# Patient Record
Sex: Female | Born: 1955 | Race: White | Hispanic: No | Marital: Married | Smoking: Never smoker
Health system: Southern US, Community
[De-identification: ages and names within clinical notes are randomized; demographics above are authoritative.]

## PROBLEM LIST (undated history)

## (undated) DIAGNOSIS — E559 Vitamin D deficiency, unspecified: Secondary | ICD-10-CM

## (undated) DIAGNOSIS — I10 Essential (primary) hypertension: Secondary | ICD-10-CM

## (undated) DIAGNOSIS — E785 Hyperlipidemia, unspecified: Secondary | ICD-10-CM

## (undated) DIAGNOSIS — M8580 Other specified disorders of bone density and structure, unspecified site: Secondary | ICD-10-CM

## (undated) DIAGNOSIS — N393 Stress incontinence (female) (male): Secondary | ICD-10-CM

## (undated) HISTORY — PX: ABDOMINAL HYSTERECTOMY: SHX81

---

## 2005-10-19 ENCOUNTER — Ambulatory Visit: Payer: Self-pay | Admitting: Family Medicine

## 2005-12-21 ENCOUNTER — Ambulatory Visit: Payer: Self-pay | Admitting: Family Medicine

## 2006-12-24 ENCOUNTER — Ambulatory Visit: Payer: Self-pay | Admitting: Family Medicine

## 2007-07-29 ENCOUNTER — Emergency Department: Payer: Self-pay | Admitting: Emergency Medicine

## 2008-02-14 ENCOUNTER — Ambulatory Visit: Payer: Self-pay | Admitting: Family Medicine

## 2008-03-03 ENCOUNTER — Ambulatory Visit: Payer: Self-pay | Admitting: Family Medicine

## 2008-05-03 ENCOUNTER — Ambulatory Visit: Payer: Self-pay | Admitting: Gastroenterology

## 2008-07-18 IMAGING — CT CT HEAD WITHOUT CONTRAST
2 series · 16 of 30 positions shown, 20 images · non-contrast
Comparison: none

REASON FOR EXAM: fall head trauma
COMMENTS:

PROCEDURE:     CT  - CT HEAD WITHOUT CONTRAST  - July 29, 2007  [DATE]
RESULT:     Comparison: No available comparison exam.
Procedure: CT examination of the head was performed without intravenous
contrast. Collimation is 5 mm.

[Series 2: without · axial · non-contrast · 0.42mm/px · z∈[+1112,+1232]mm · 13 of 30 slices shown, 17 images]
[im 3/30  brain]
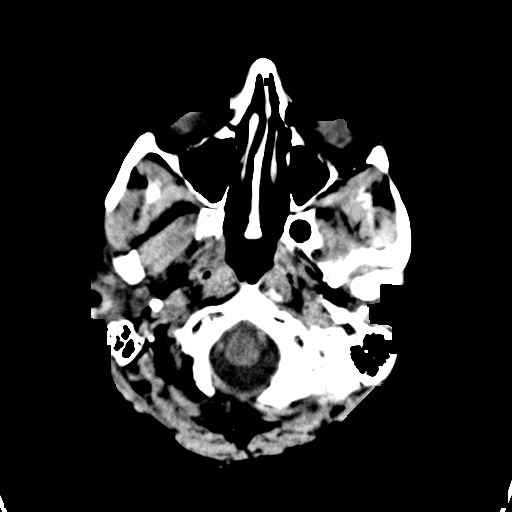
[im 3/30  bone]
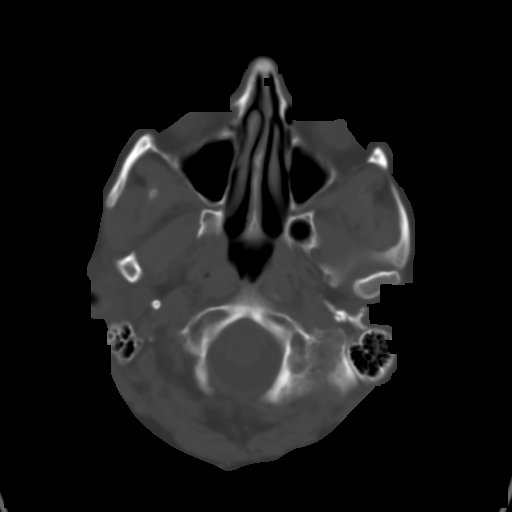
[im 5/30  brain]
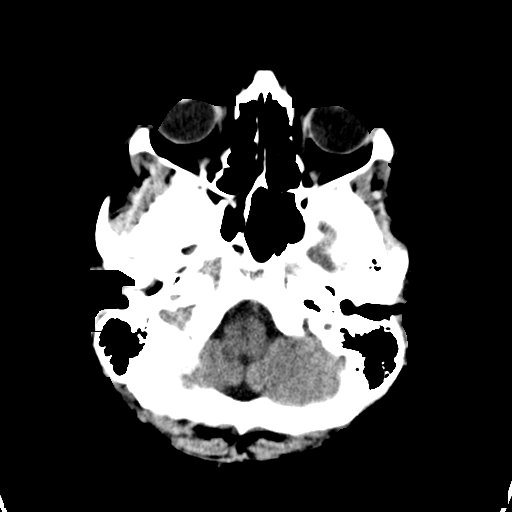
[im 7/30  brain]
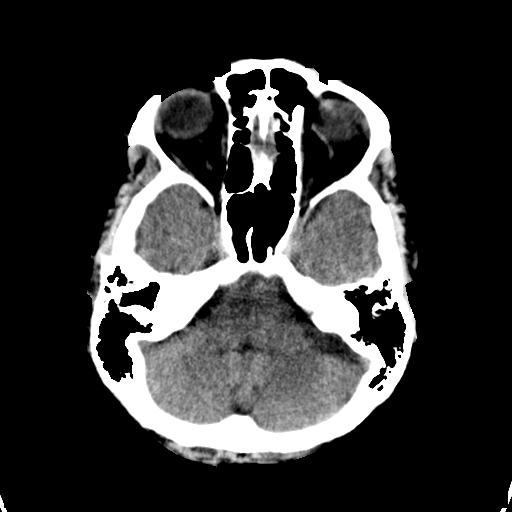
[im 9/30  brain]
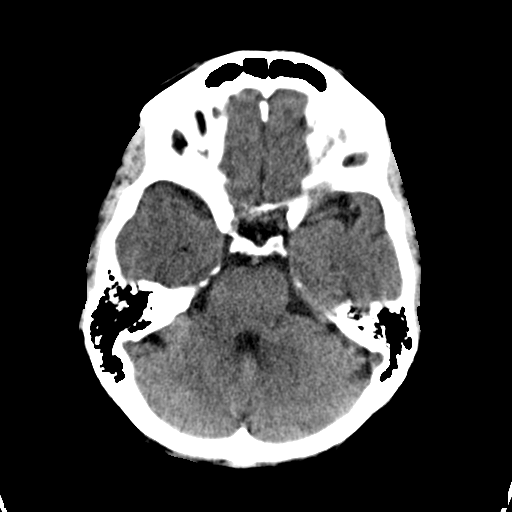
[im 11/30  brain]
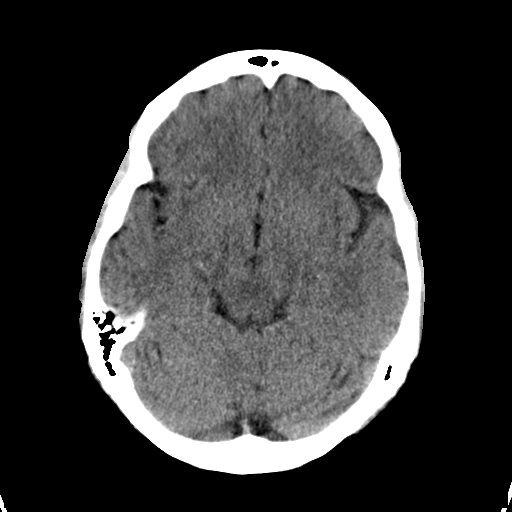
[im 11/30  bone]
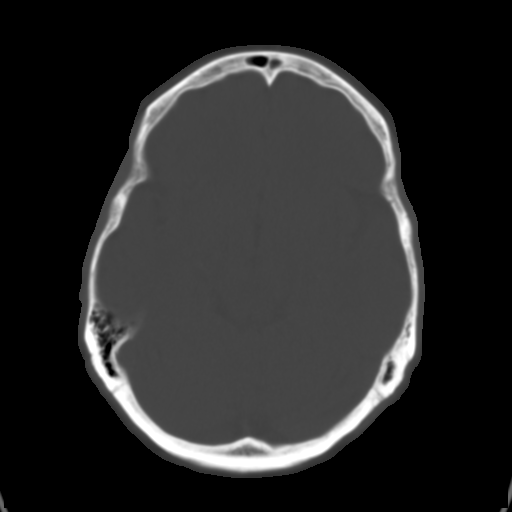
[im 13/30  brain]
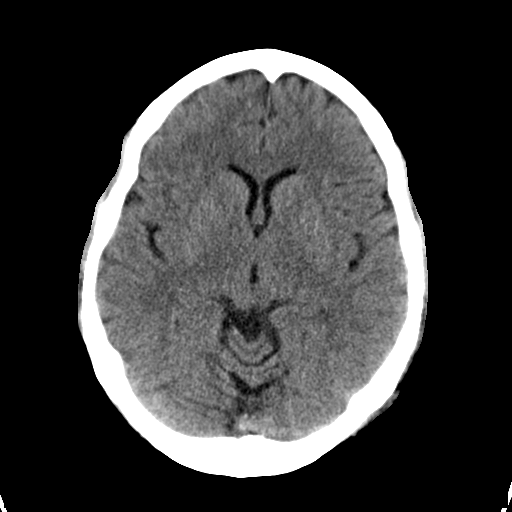
[im 15/30  brain]
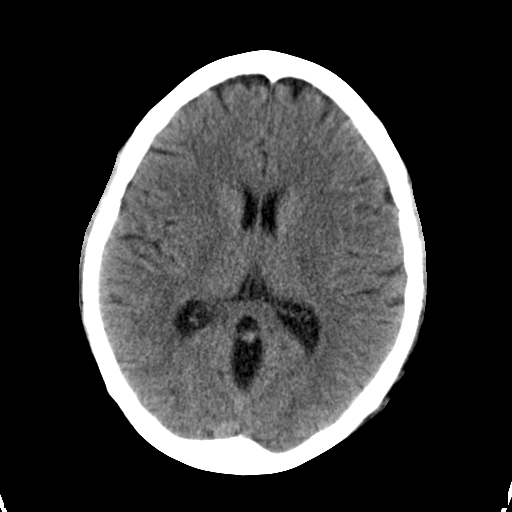
[im 17/30  brain]
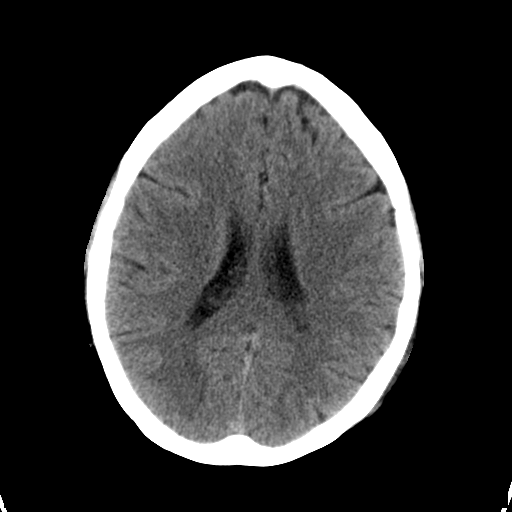
[im 19/30  brain]
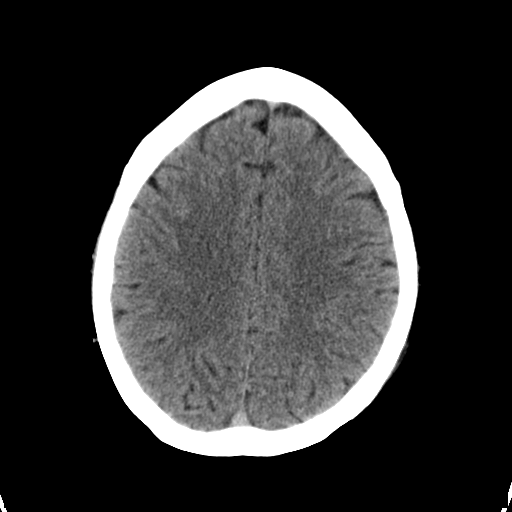
[im 19/30  bone]
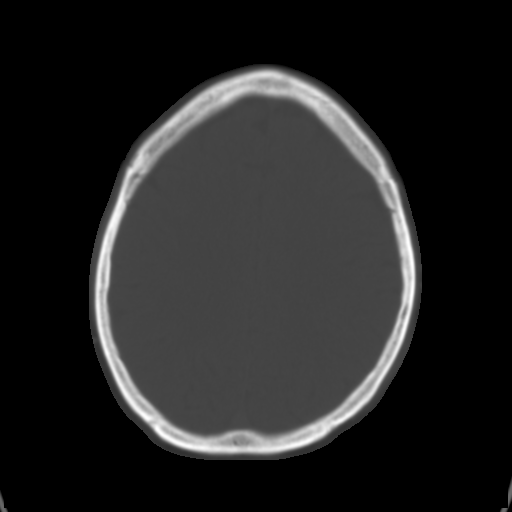
[im 21/30  brain]
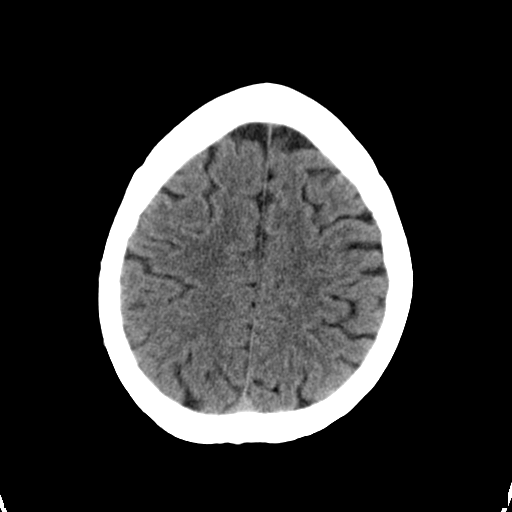
[im 23/30  brain]
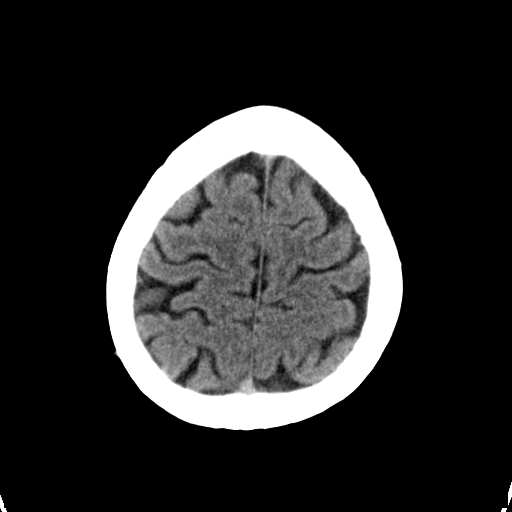
[im 25/30  brain]
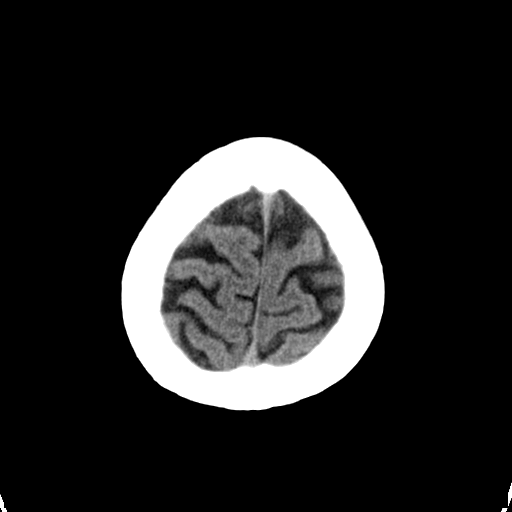
[im 27/30  brain]
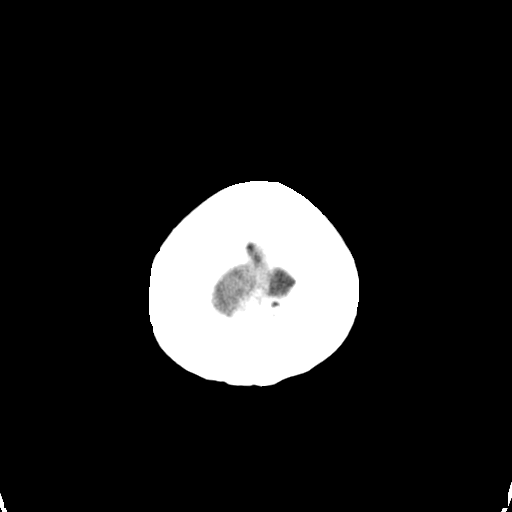
[im 27/30  bone]
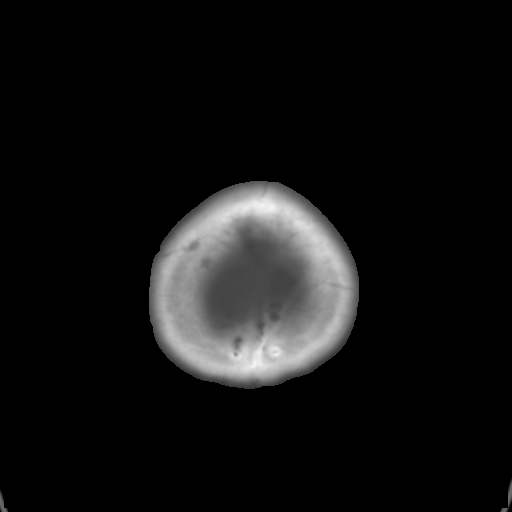

[Series 3: bone · axial · 0.42mm/px · z∈[+1112,+1152]mm · 3 of 30 slices shown]
[im 3/30  bone]
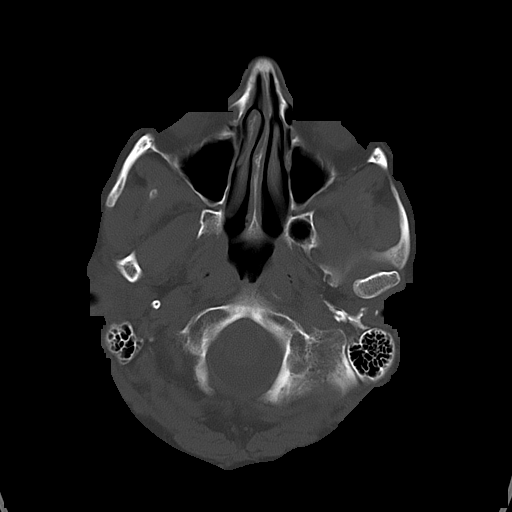
[im 7/30  bone]
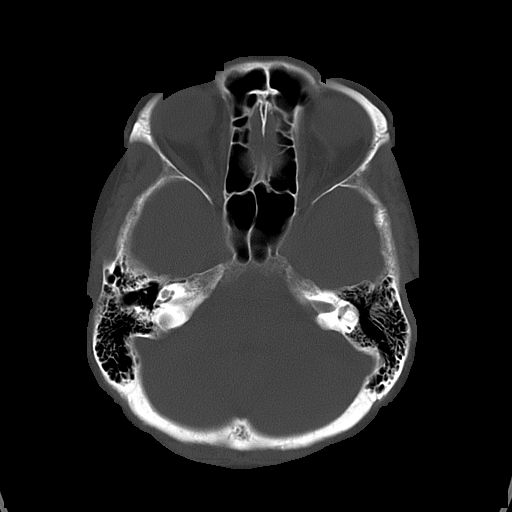
[im 11/30  bone]
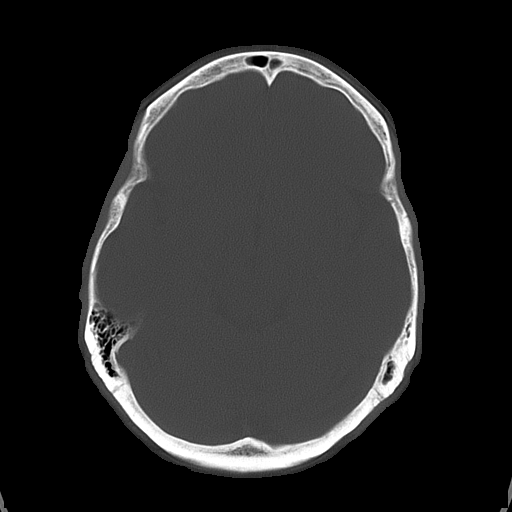

[16 of 30 positions shown; findings below may reference images not displayed]

FINDINGS: No evidence of intracranial hemorrhage, mass-effect, or ventricular
dilatation. The gray and white matters are differentiated. No displaced
calvarial fracture is noted. The partially visualized paranasal sinuses and
mastoid air cells are unremarkable.
IMPRESSION: 1. No evidence of acute intra cranial hemorrhage nor displaced calvarial
fracture.

Preliminary report was faxed to the emergency room by the night radiologist
shortly after the study was performed.

## 2009-05-17 ENCOUNTER — Ambulatory Visit: Payer: Self-pay | Admitting: Family Medicine

## 2010-11-19 ENCOUNTER — Ambulatory Visit: Payer: Self-pay | Admitting: Family Medicine

## 2011-03-10 DIAGNOSIS — I10 Essential (primary) hypertension: Secondary | ICD-10-CM | POA: Insufficient documentation

## 2012-01-01 ENCOUNTER — Ambulatory Visit: Payer: Self-pay | Admitting: Family Medicine

## 2012-04-24 ENCOUNTER — Ambulatory Visit: Payer: Self-pay

## 2013-12-06 DIAGNOSIS — E559 Vitamin D deficiency, unspecified: Secondary | ICD-10-CM | POA: Insufficient documentation

## 2014-01-01 ENCOUNTER — Ambulatory Visit: Payer: Self-pay | Admitting: Family Medicine

## 2016-12-30 ENCOUNTER — Other Ambulatory Visit: Payer: Self-pay | Admitting: Family Medicine

## 2016-12-30 DIAGNOSIS — Z1231 Encounter for screening mammogram for malignant neoplasm of breast: Secondary | ICD-10-CM

## 2017-01-06 ENCOUNTER — Ambulatory Visit
Admission: RE | Admit: 2017-01-06 | Discharge: 2017-01-06 | Disposition: A | Discharge status: Home / Self Care | Payer: 59 | Source: Ambulatory Visit | Attending: Family Medicine | Admitting: Family Medicine

## 2017-01-06 ENCOUNTER — Encounter: Payer: Self-pay | Admitting: Radiology

## 2017-01-06 DIAGNOSIS — Z1231 Encounter for screening mammogram for malignant neoplasm of breast: Secondary | ICD-10-CM | POA: Insufficient documentation

## 2017-01-19 ENCOUNTER — Other Ambulatory Visit: Payer: Self-pay | Admitting: Family Medicine

## 2017-01-19 DIAGNOSIS — M8580 Other specified disorders of bone density and structure, unspecified site: Secondary | ICD-10-CM

## 2017-09-22 ENCOUNTER — Ambulatory Visit
Admission: RE | Admit: 2017-09-22 | Discharge: 2017-09-22 | Disposition: A | Discharge status: Home / Self Care | Payer: 59 | Source: Ambulatory Visit | Attending: Internal Medicine | Admitting: Internal Medicine

## 2017-09-22 ENCOUNTER — Encounter
Admission: RE | Disposition: A | Discharge status: Home / Self Care | Payer: Self-pay | Source: Ambulatory Visit | Attending: Internal Medicine

## 2017-09-22 ENCOUNTER — Encounter: Payer: Self-pay | Admitting: *Deleted

## 2017-09-22 ENCOUNTER — Ambulatory Visit: Payer: 59 | Admitting: Anesthesiology

## 2017-09-22 DIAGNOSIS — Z888 Allergy status to other drugs, medicaments and biological substances status: Secondary | ICD-10-CM | POA: Diagnosis not present

## 2017-09-22 DIAGNOSIS — E785 Hyperlipidemia, unspecified: Secondary | ICD-10-CM | POA: Insufficient documentation

## 2017-09-22 DIAGNOSIS — E559 Vitamin D deficiency, unspecified: Secondary | ICD-10-CM | POA: Diagnosis not present

## 2017-09-22 DIAGNOSIS — I10 Essential (primary) hypertension: Secondary | ICD-10-CM | POA: Diagnosis not present

## 2017-09-22 DIAGNOSIS — Z882 Allergy status to sulfonamides status: Secondary | ICD-10-CM | POA: Insufficient documentation

## 2017-09-22 DIAGNOSIS — Z79899 Other long term (current) drug therapy: Secondary | ICD-10-CM | POA: Insufficient documentation

## 2017-09-22 DIAGNOSIS — M8580 Other specified disorders of bone density and structure, unspecified site: Secondary | ICD-10-CM | POA: Insufficient documentation

## 2017-09-22 DIAGNOSIS — Z1211 Encounter for screening for malignant neoplasm of colon: Secondary | ICD-10-CM | POA: Diagnosis not present

## 2017-09-22 DIAGNOSIS — K5730 Diverticulosis of large intestine without perforation or abscess without bleeding: Secondary | ICD-10-CM | POA: Insufficient documentation

## 2017-09-22 DIAGNOSIS — K640 First degree hemorrhoids: Secondary | ICD-10-CM | POA: Insufficient documentation

## 2017-09-22 HISTORY — DX: Other specified disorders of bone density and structure, unspecified site: M85.80

## 2017-09-22 HISTORY — DX: Stress incontinence (female) (male): N39.3

## 2017-09-22 HISTORY — DX: Essential (primary) hypertension: I10

## 2017-09-22 HISTORY — PX: COLONOSCOPY WITH PROPOFOL: SHX5780

## 2017-09-22 HISTORY — DX: Hyperlipidemia, unspecified: E78.5

## 2017-09-22 HISTORY — DX: Vitamin D deficiency, unspecified: E55.9

## 2017-09-22 SURGERY — COLONOSCOPY WITH PROPOFOL
Anesthesia: General

## 2017-09-22 MED ORDER — PROPOFOL 10 MG/ML IV BOLUS
INTRAVENOUS | Status: AC
  Filled 2017-09-22: qty 20

## 2017-09-22 MED ORDER — FENTANYL CITRATE (PF) 100 MCG/2ML IJ SOLN
INTRAMUSCULAR | Status: DC | PRN
  Administered 2017-09-22 (×2): 50 ug via INTRAVENOUS

## 2017-09-22 MED ORDER — SODIUM CHLORIDE 0.9 % IV SOLN
INTRAVENOUS | Status: DC
  Administered 2017-09-22: 09:00:00 via INTRAVENOUS

## 2017-09-22 MED ORDER — LIDOCAINE 2% (20 MG/ML) 5 ML SYRINGE
INTRAMUSCULAR | Status: DC | PRN
  Administered 2017-09-22: 30 mg via INTRAVENOUS

## 2017-09-22 MED ORDER — PROPOFOL 10 MG/ML IV BOLUS
INTRAVENOUS | Status: DC | PRN
  Administered 2017-09-22: 100 mg via INTRAVENOUS

## 2017-09-22 MED ORDER — PROPOFOL 500 MG/50ML IV EMUL
INTRAVENOUS | Status: AC
  Filled 2017-09-22: qty 50

## 2017-09-22 MED ORDER — FENTANYL CITRATE (PF) 100 MCG/2ML IJ SOLN
INTRAMUSCULAR | Status: AC
  Filled 2017-09-22: qty 2

## 2017-09-22 MED ORDER — PROPOFOL 500 MG/50ML IV EMUL
INTRAVENOUS | Status: DC | PRN
  Administered 2017-09-22: 140 ug/kg/min via INTRAVENOUS

## 2017-09-22 NOTE — Interval H&P Note (Signed)
History and Physical Interval Note:  09/22/2017 9:16 AM  Robin Beck  has presented today for surgery, with the diagnosis of SCREENING  The various methods of treatment have been discussed with the patient and family. After consideration of risks, benefits and other options for treatment, the patient has consented to  Procedure(s): COLONOSCOPY WITH PROPOFOL (N/A) as a surgical intervention .  The patient's history has been reviewed, patient examined, no change in status, stable for surgery.  I have reviewed the patient's chart and labs.  Questions were answered to the patient's satisfaction.     Vernon Hillsoledo, [place]

## 2017-09-22 NOTE — Op Note (Signed)
[facility] Gastroenterology Patient Name: Robin Beck Procedure Date: 09/22/2017 9:14 AM MRN: [id number] Account #: [id number] Date of Birth: 09-29-1955 Admit Type: Outpatient Age: 62 Room: [facility] ENDO ROOM 2 Gender: Female Note Status: Finalized Procedure:            Colonoscopy Indications:          Screening for colorectal malignant neoplasm Providers:            Boykin Nearing. Taja Pentland MD, MD Medicines:            Propofol per Anesthesia Complications:        No immediate complications. Procedure:            Pre-Anesthesia Assessment:                       - The risks and benefits of the procedure and the                        sedation options and risks were discussed with the                        patient. All questions were answered and informed                        consent was obtained.                       - Patient identification and proposed procedure were                        verified prior to the procedure by the nurse. The                        procedure was verified in the procedure room.                       - ASA Grade Assessment: II - A patient with mild                        systemic disease.                       - After reviewing the risks and benefits, the patient                        was deemed in satisfactory condition to undergo the                        procedure.                       After obtaining informed consent, the colonoscope was                        passed under direct vision. Throughout the procedure,                        the patient's blood pressure, pulse, and oxygen                        saturations were monitored continuously. The  Colonoscope was introduced through the anus and                        advanced to the the cecum, identified by appendiceal                        orifice and ileocecal valve. The colonoscopy was                        performed without difficulty. The patient  tolerated the                        procedure well. The quality of the bowel preparation                        was good. The ileocecal valve, appendiceal orifice, and                        rectum were photographed. Findings:      The perianal and digital rectal examinations were normal. Pertinent       negatives include normal sphincter tone and no palpable rectal lesions.      Normal mucosa was found in the entire colon.      A few small-mouthed diverticula were found in the sigmoid colon. There       was no evidence of diverticular bleeding.      Non-bleeding internal hemorrhoids were found during retroflexion. The       hemorrhoids were Grade I (internal hemorrhoids that do not prolapse).      The exam was otherwise without abnormality. Impression:           - Normal mucosa in the entire examined colon.                       - Mild diverticulosis in the sigmoid colon. There was                        no evidence of diverticular bleeding.                       - Non-bleeding internal hemorrhoids.                       - The examination was otherwise normal.                       - No specimens collected. Recommendation:       - Patient has a contact number available for                        emergencies. The signs and symptoms of potential                        delayed complications were discussed with the patient.                        Return to normal activities tomorrow. Written discharge                        instructions were provided to the patient.                       -  Resume previous diet.                       - Continue present medications.                       - Repeat colonoscopy in 10 years for screening purposes.                       - Return to GI office PRN.                       - The findings and recommendations were discussed with                        the patient and their spouse. Procedure Code(s):    --- Professional ---                       Z3664,  Colorectal cancer screening; colonoscopy on                        individual not meeting criteria for high risk Diagnosis Code(s):    --- Professional ---                       Z12.11, Encounter for screening for malignant neoplasm                        of colon                       K64.0, First degree hemorrhoids                       K57.30, Diverticulosis of large intestine without                        perforation or abscess without bleeding CPT copyright 2016 [employer]. All rights reserved. The codes documented in this report are preliminary and upon coder review may  be revised to meet current compliance requirements. Stanton Kidney MD, MD 09/22/2017 9:42:59 AM This report has been signed electronically. Number of Addenda: 0 Note Initiated On: 09/22/2017 9:14 AM Scope Withdrawal Time: 0 hours 6 minutes 4 seconds  Total Procedure Duration: 0 hours 10 minutes 22 seconds       [facility]

## 2017-09-22 NOTE — Transfer of Care (Signed)
Immediate Anesthesia Transfer of Care Note  Patient: Robin Beck  Procedure(s) Performed: COLONOSCOPY WITH PROPOFOL (N/A )  Patient Location: PACU and Endoscopy Unit  Anesthesia Type:General  Level of Consciousness: sedated  Airway & Oxygen Therapy: Patient Spontanous Breathing and Patient connected to nasal cannula oxygen  Post-op Assessment: Report given to RN and Post -op Vital signs reviewed and stable  Post vital signs: Reviewed and stable  Last Vitals:  Vitals:   09/22/17 0845 09/22/17 0940  BP: (!) 156/83   Pulse: 71 (P) 69  Resp: 14 (P) 14  Temp: 36.6 C (!) (P) 36.3 C  SpO2: 100% (P) 100%    Last Pain:  Vitals:   09/22/17 0940  TempSrc: (P) Tympanic         Complications: No apparent anesthesia complications

## 2017-09-22 NOTE — H&P (Signed)
Outpatient short stay form Pre-procedure 09/22/2017 9:15 AM Robin Beck Robin Beck, M.D.  Primary Physician: Robin Beck, M.D.  Reason for visit:  Colon cancer screening  History of present illness:  Patient presents for colon cancer screening. The patient denies abdominal pain, abnormal weight loss or rectal bleeding.     Current Facility-Administered Medications:  .  0.9 %  sodium chloride infusion, , Intravenous, Continuous, Robin Beck, Last Rate: 20 mL/hr at 09/22/17 [id number]  Medications Prior to Admission  Medication Sig Dispense Refill Last Dose  . Calcium Carbonate-Vitamin D ([employer] CALCIUM CARBONATE/VIT D PO) Take by mouth.   Past Week at Unknown time  . loratadine (CLARITIN) 10 MG tablet Take 10 mg by mouth daily.   Past Week at Unknown time  . metoprolol succinate (TOPROL-XL) 25 MG 24 hr tablet Take 25 mg by mouth daily.   09/22/2017 at 0530  . pravastatin (PRAVACHOL) 40 MG tablet Take 40 mg by mouth daily.   09/21/2017 at Unknown time     Allergies  Allergen Reactions  . Pollen Extract Shortness Of Breath  . Lisinopril Rash  . Sulfa Antibiotics Rash     Past Medical History:  Diagnosis Date  . Hyperlipidemia   . Hypertension   . Osteopenia   . Stress incontinence   . Vitamin D deficiency     Review of systems:   Negative   Physical Exam  General appearance: alert, cooperative and appears stated age Resp: clear to auscultation bilaterally Cardio: regular rate and rhythm, S1, S2 normal, no murmur, click, rub or gallop GI: soft, non-tender; bowel sounds normal; no masses,  no organomegaly Extremities: extremities normal, atraumatic, no cyanosis or edema     Planned procedures: Colonoscopy. The patient understands the nature of the planned procedure, indications, risks, alternatives and potential complications including but not limited to bleeding, infection, perforation, damage to internal organs and possible oversedation/side effects from  anesthesia. The patient agrees and gives consent to proceed.  Please refer to procedure notes for findings, recommendations and patient disposition/instructions.    Robin Beck, M.D. Gastroenterology 09/22/2017  9:15 AM

## 2017-09-22 NOTE — Anesthesia Post-op Follow-up Note (Signed)
Anesthesia QCDR form completed.        

## 2017-09-22 NOTE — Anesthesia Postprocedure Evaluation (Signed)
Anesthesia Post Note  Patient: Robin Beck  Procedure(s) Performed: COLONOSCOPY WITH PROPOFOL (N/A )  Patient location during evaluation: Endoscopy Anesthesia Type: General Level of consciousness: awake and alert Pain management: pain level controlled Vital Signs Assessment: post-procedure vital signs reviewed and stable Respiratory status: spontaneous breathing, nonlabored ventilation, respiratory function stable and patient connected to nasal cannula oxygen Cardiovascular status: blood pressure returned to baseline and stable Postop Assessment: no apparent nausea or vomiting Anesthetic complications: no     Last Vitals:  Vitals:   09/22/17 0950 09/22/17 1000  BP: 124/84 (!) 144/99  Pulse: 60 (!) 58  Resp: 10 18  Temp:    SpO2: 98% 100%    Last Pain:  Vitals:   09/22/17 0940  TempSrc: Tympanic                 [name]

## 2017-09-22 NOTE — Anesthesia Preprocedure Evaluation (Signed)
Anesthesia Evaluation  Patient identified by MRN, date of birth, ID band Patient awake    Reviewed: Allergy & Precautions, H&P , NPO status , reviewed documented beta blocker date and time   Airway Mallampati: I  TM Distance: >3 FB     Dental  (+) Chipped   Pulmonary    Pulmonary exam normal        Cardiovascular hypertension, Normal cardiovascular exam     Neuro/Psych    GI/Hepatic   Endo/Other    Renal/GU      Musculoskeletal   Abdominal   Peds  Hematology   Anesthesia Other Findings   Reproductive/Obstetrics                             Anesthesia Physical Anesthesia Plan  ASA: II  Anesthesia Plan: General   Post-op Pain Management:    Induction:   PONV Risk Score and Plan: 2 and Propofol infusion  Airway Management Planned:   Additional Equipment:   Intra-op Plan:   Post-operative Plan:   Informed Consent: I have reviewed the patients History and Physical, chart, labs and discussed the procedure including the risks, benefits and alternatives for the proposed anesthesia with the patient or authorized representative who has indicated his/her understanding and acceptance.   Dental Advisory Given  Plan Discussed with: CRNA  Anesthesia Plan Comments:         Anesthesia Quick Evaluation

## 2017-09-23 ENCOUNTER — Encounter: Payer: Self-pay | Admitting: Internal Medicine

## 2018-01-20 ENCOUNTER — Other Ambulatory Visit: Payer: Self-pay | Admitting: Family Medicine

## 2018-01-20 DIAGNOSIS — Z780 Asymptomatic menopausal state: Secondary | ICD-10-CM

## 2018-01-20 DIAGNOSIS — M8580 Other specified disorders of bone density and structure, unspecified site: Secondary | ICD-10-CM

## 2019-02-15 ENCOUNTER — Other Ambulatory Visit: Payer: Self-pay | Admitting: Family Medicine

## 2019-02-15 DIAGNOSIS — Z1231 Encounter for screening mammogram for malignant neoplasm of breast: Secondary | ICD-10-CM

## 2019-02-15 DIAGNOSIS — Z780 Asymptomatic menopausal state: Secondary | ICD-10-CM

## 2020-02-26 ENCOUNTER — Other Ambulatory Visit: Payer: Self-pay | Admitting: Family Medicine

## 2020-02-26 DIAGNOSIS — M8589 Other specified disorders of bone density and structure, multiple sites: Secondary | ICD-10-CM

## 2020-12-07 ENCOUNTER — Other Ambulatory Visit: Payer: Self-pay

## 2020-12-07 ENCOUNTER — Encounter: Payer: Self-pay | Admitting: Emergency Medicine

## 2020-12-07 ENCOUNTER — Ambulatory Visit
Admission: EM | Admit: 2020-12-07 | Discharge: 2020-12-07 | Disposition: A | Discharge status: Home / Self Care | Payer: Medicare Other | Attending: Family Medicine | Admitting: Family Medicine

## 2020-12-07 DIAGNOSIS — L247 Irritant contact dermatitis due to plants, except food: Secondary | ICD-10-CM

## 2020-12-07 MED ORDER — HYDROXYZINE HCL 25 MG PO TABS: 25.0000 mg | ORAL_TABLET | Freq: Four times a day (QID) | ORAL | 0 refills | Status: DC

## 2020-12-07 MED ORDER — PREDNISONE 20 MG PO TABS: 60.0000 mg | ORAL_TABLET | Freq: Every day | ORAL | 0 refills | Status: AC

## 2020-12-07 MED ORDER — FEXOFENADINE HCL 180 MG PO TABS: 180.0000 mg | ORAL_TABLET | Freq: Every day | ORAL | 1 refills | Status: AC

## 2020-12-07 NOTE — ED Triage Notes (Signed)
Patient states that she was working out in her yard yesterday.  Patient reports itchy red rash on her face and arms.  Patient has swelling in her face and eyes.

## 2020-12-07 NOTE — Discharge Instructions (Addendum)
Start the prednisone this morning when you get it.  Make sure you eat something before you take the prednisone.  You will take 60 mg each morning for the next 5 days to help decrease inflammation and help with itching.  Stop your cetirizine and start taking fexofenadine 180 mg once daily to help with itching as well as the swelling.  Use the hydroxyzine at bedtime to help you with sleep.  If you have any increase in your facial swelling, you develop changes in your vision, you have trouble swallowing or breathing you need to go to the emergency room for evaluation.

## 2020-12-07 NOTE — ED Provider Notes (Signed)
[facility]    CSN: [id number] Arrival date & time: 12/07/20  0816      History   Chief Complaint Chief Complaint  Patient presents with  . Rash    HPI Robin Beck is a 65 y.o. female.   HPI   65 year old female here for evaluation of a rash.  Patient reports that she was working in her yard yesterday and then last night she developed swelling, redness, and itching to her face.  She reports that she has been taking Benadryl every 3 hours to help with the itching.  This morning she woke up and both of her eyes were swollen with some discharge that she cannot describe the color and redness and swelling to her face and upper neck.  Patient denies any changes to vision, difficulty breathing, or difficulty swallowing.  Patient also has similar red splotchy patches on both forearms with fluid-filled vesicles forming.  Past Medical History:  Diagnosis Date  . Hyperlipidemia   . Hypertension   . Osteopenia   . Stress incontinence   . Vitamin D deficiency     There are no problems to display for this patient.   Past Surgical History:  Procedure Laterality Date  . ABDOMINAL HYSTERECTOMY    . COLONOSCOPY WITH PROPOFOL N/A 09/22/2017   Procedure: COLONOSCOPY WITH PROPOFOL;  Surgeon: Toledo, Boykin Nearing, MD;  Location: [facility] ENDOSCOPY;  Service: Gastroenterology;  Laterality: N/A;    OB History   No obstetric history on file.      Home Medications    Prior to Admission medications   Medication Sig Start Date End Date Taking? Authorizing Provider  Calcium Carbonate-Vitamin D ([employer] CALCIUM CARBONATE/VIT D PO) Take by mouth.   Yes [provider]  cetirizine (ZYRTEC) 10 MG chewable tablet Chew 10 mg by mouth daily.   Yes [provider]  fexofenadine (ALLEGRA) 180 MG tablet Take 1 tablet (180 mg total) by mouth daily. 12/07/20  Yes Becky Augusta, NP  hydrochlorothiazide (HYDRODIURIL) 25 MG tablet Take 1 tablet by mouth daily. 09/22/20  Yes [provider]  hydrOXYzine (ATARAX/VISTARIL) 25 MG tablet Take 1 tablet (25 mg total) by mouth every 6 (six) hours. 12/07/20  Yes Becky Augusta, NP  metoprolol succinate (TOPROL-XL) 25 MG 24 hr tablet Take 25 mg by mouth daily.   Yes [provider]  pravastatin (PRAVACHOL) 40 MG tablet Take 40 mg by mouth daily.   Yes [provider]  predniSONE (DELTASONE) 20 MG tablet Take 3 tablets (60 mg total) by mouth daily with breakfast for 5 days. 3 tablets by mouth daily for 3 days, 2 tablets by mouth daily for 4 days, and 1 tablet by mouth daily for 5 days. 12/07/20 12/12/20 Yes Becky Augusta, NP  loratadine (CLARITIN) 10 MG tablet Take 10 mg by mouth daily.  12/07/20  [provider]    Family History Family History  Problem Relation Age of Onset  . Breast cancer Neg Hx     Social History Social History   Tobacco Use  . Smoking status: Never Smoker  . Smokeless tobacco: Never Used  Vaping Use  . Vaping Use: Never used  Substance Use Topics  . Alcohol use: Yes    Alcohol/week: 6.0 standard drinks    Types: 6 Glasses of wine per week  . Drug use: No     Allergies   Pollen extract, Lisinopril, and Sulfa antibiotics   Review of Systems Review of Systems  Constitutional: Negative for activity  change and appetite change.  HENT: Negative for trouble swallowing.   Eyes: Negative for visual disturbance.  Respiratory: Negative for wheezing and stridor.   Skin: Positive for color change and rash.  Hematological: Negative.   Psychiatric/Behavioral: Negative.      Physical Exam Triage Vital Signs ED Triage Vitals  Enc Vitals Group     BP 12/07/20 0829 (!) 148/99     Pulse Rate 12/07/20 0829 81     Resp 12/07/20 0829 14     Temp 12/07/20 0829 99.1 F (37.3 C)     Temp Source 12/07/20 0829 Oral     SpO2 12/07/20 0829 100 %     Weight 12/07/20 0826 148 lb (67.1 kg)     Height 12/07/20 0826 5\' 6"  (1.676 m)     Head Circumference --      Peak Flow --       Pain Score 12/07/20 0826 0     Pain Loc --      Pain Edu? --      Excl. in GC? --    No data found.  Updated Vital Signs BP (!) 148/99 (BP Location: Left Arm)   Pulse 81   Temp 99.1 F (37.3 C) (Oral)   Resp 14   Ht 5\' 6"  (1.676 m)   Wt 148 lb (67.1 kg)   SpO2 100%   BMI 23.89 kg/m   Visual Acuity Right Eye Distance:   Left Eye Distance:   Bilateral Distance:    Right Eye Near:   Left Eye Near:    Bilateral Near:     Physical Exam Vitals and nursing note reviewed.  Constitutional:      General: She is not in acute distress.    Appearance: Normal appearance. She is normal weight. She is not ill-appearing.  Eyes:     Extraocular Movements: Extraocular movements intact.     Conjunctiva/sclera: Conjunctivae normal.     Pupils: Pupils are equal, round, and reactive to light.  Cardiovascular:     Rate and Rhythm: Normal rate and regular rhythm.     Pulses: Normal pulses.     Heart sounds: Normal heart sounds. No murmur heard. No gallop.   Pulmonary:     Effort: Pulmonary effort is normal.     Breath sounds: Normal breath sounds. No stridor. No wheezing, rhonchi or rales.  Musculoskeletal:        General: Swelling present.  Skin:    General: Skin is warm and dry.     Capillary Refill: Capillary refill takes less than 2 seconds.     Findings: Erythema present.  Neurological:     General: No focal deficit present.     Mental Status: She is alert and oriented to person, place, and time.  Psychiatric:        Mood and Affect: Mood normal.        Behavior: Behavior normal.        Thought Content: Thought content normal.        Judgment: Judgment normal.      UC Treatments / Results  Labs (all labs ordered are listed, but only abnormal results are displayed) Labs Reviewed - No data to display  EKG   Radiology No results found.  Procedures Procedures (including critical care time)  Medications Ordered in UC Medications - No data to display  Initial  Impression / Assessment and Plan / UC Course  I have reviewed the triage vital signs and the nursing notes.  Pertinent labs & imaging results that were available during my care of the patient were reviewed by me and considered in my medical decision making (see chart for details).   Patient is a very pleasant 65 year old female here for evaluation of a red itchy rash to her face, neck, and both forearms.  The rash started after she worked in her yard yesterday.  She thinks is poison ivy because she started to develop some blisters on the right forearm in the middle of the red raised patch.  Patient has marked edema around both periorbital regions without induration, or tenderness.  There is no visible drainage on her lashes or in the eyelid margins.  Patient states she is not have any visual changes.  The redness and swelling extends down on both cheeks and then is present on the anterior aspect of both sides of her neck.  Patient not have any difficulty breathing or swallowing.  No stridor auscultated over the trachea.  Lung sounds are clear to auscultation.  Suspect patient has poison ivy and will treat with prednisone and antihistamines.  Patient vies that if her facial swelling gets worse, she develops any trouble breathing or swallowing, or changes in her vision she is to go to the ER for evaluation.   Final Clinical Impressions(s) / UC Diagnoses   Final diagnoses:  Irritant contact dermatitis due to plants, except food     Discharge Instructions     Start the prednisone this morning when you get it.  Make sure you eat something before you take the prednisone.  You will take 60 mg each morning for the next 5 days to help decrease inflammation and help with itching.  Stop your cetirizine and start taking fexofenadine 180 mg once daily to help with itching as well as the swelling.  Use the hydroxyzine at bedtime to help you with sleep.  If you have any increase in your facial swelling, you  develop changes in your vision, you have trouble swallowing or breathing you need to go to the emergency room for evaluation.    ED Prescriptions    Medication Sig Dispense Auth. Provider   predniSONE (DELTASONE) 20 MG tablet Take 3 tablets (60 mg total) by mouth daily with breakfast for 5 days. 3 tablets by mouth daily for 3 days, 2 tablets by mouth daily for 4 days, and 1 tablet by mouth daily for 5 days. 15 tablet Becky Augusta, NP   hydrOXYzine (ATARAX/VISTARIL) 25 MG tablet Take 1 tablet (25 mg total) by mouth every 6 (six) hours. 12 tablet Becky Augusta, NP   fexofenadine (ALLEGRA) 180 MG tablet Take 1 tablet (180 mg total) by mouth daily. 30 tablet Becky Augusta, NP     PDMP not reviewed this encounter.   Becky Augusta, NP 12/07/20 [phone]

## 2022-09-18 ENCOUNTER — Other Ambulatory Visit: Payer: Self-pay | Admitting: Family Medicine

## 2022-09-18 DIAGNOSIS — Z1231 Encounter for screening mammogram for malignant neoplasm of breast: Secondary | ICD-10-CM

## 2022-09-24 ENCOUNTER — Ambulatory Visit
Admission: RE | Admit: 2022-09-24 | Discharge: 2022-09-24 | Disposition: A | Discharge status: Home / Self Care | Payer: Medicare PPO | Source: Ambulatory Visit | Attending: Family Medicine | Admitting: Family Medicine

## 2022-09-24 DIAGNOSIS — Z1231 Encounter for screening mammogram for malignant neoplasm of breast: Secondary | ICD-10-CM | POA: Diagnosis present

## 2022-09-28 ENCOUNTER — Inpatient Hospital Stay: Payer: Medicare PPO | Attending: Internal Medicine | Admitting: Internal Medicine

## 2022-09-28 ENCOUNTER — Inpatient Hospital Stay: Payer: Medicare PPO

## 2022-09-28 ENCOUNTER — Encounter: Payer: Self-pay | Admitting: Internal Medicine

## 2022-09-28 VITALS — BP 124/81 | HR 58 | Resp 18 | Ht 66.0 in | Wt 137.0 lb

## 2022-09-28 DIAGNOSIS — M8580 Other specified disorders of bone density and structure, unspecified site: Secondary | ICD-10-CM | POA: Diagnosis not present

## 2022-09-28 DIAGNOSIS — R9230 Dense breasts, unspecified: Secondary | ICD-10-CM | POA: Insufficient documentation

## 2022-09-28 DIAGNOSIS — Z90710 Acquired absence of both cervix and uterus: Secondary | ICD-10-CM | POA: Diagnosis not present

## 2022-09-28 DIAGNOSIS — D7589 Other specified diseases of blood and blood-forming organs: Secondary | ICD-10-CM | POA: Diagnosis present

## 2022-09-28 DIAGNOSIS — N393 Stress incontinence (female) (male): Secondary | ICD-10-CM | POA: Insufficient documentation

## 2022-09-28 DIAGNOSIS — Z79899 Other long term (current) drug therapy: Secondary | ICD-10-CM | POA: Diagnosis not present

## 2022-09-28 DIAGNOSIS — I10 Essential (primary) hypertension: Secondary | ICD-10-CM | POA: Insufficient documentation

## 2022-09-28 DIAGNOSIS — D582 Other hemoglobinopathies: Secondary | ICD-10-CM

## 2022-09-28 DIAGNOSIS — E785 Hyperlipidemia, unspecified: Secondary | ICD-10-CM | POA: Insufficient documentation

## 2022-09-28 LAB — CBC WITH DIFFERENTIAL/PLATELET
Abs Immature Granulocytes: 0.01 10*3/uL (ref 0.00–0.07)
Basophils Absolute: 0 10*3/uL (ref 0.0–0.1)
Basophils Relative: 1 %
Eosinophils Absolute: 0.1 10*3/uL (ref 0.0–0.5)
Eosinophils Relative: 2 %
HCT: 45.2 % (ref 36.0–46.0)
Hemoglobin: 15.5 g/dL — ABNORMAL HIGH (ref 12.0–15.0)
Immature Granulocytes: 0 %
Lymphocytes Relative: 38 %
Lymphs Abs: 1.4 10*3/uL (ref 0.7–4.0)
MCH: 34.4 pg — ABNORMAL HIGH (ref 26.0–34.0)
MCHC: 34.3 g/dL (ref 30.0–36.0)
MCV: 100.4 fL — ABNORMAL HIGH (ref 80.0–100.0)
Monocytes Absolute: 0.4 10*3/uL (ref 0.1–1.0)
Monocytes Relative: 10 %
Neutro Abs: 1.8 10*3/uL (ref 1.7–7.7)
Neutrophils Relative %: 49 %
Platelets: 226 10*3/uL (ref 150–400)
RBC: 4.5 MIL/uL (ref 3.87–5.11)
RDW: 12 % (ref 11.5–15.5)
WBC: 3.7 10*3/uL — ABNORMAL LOW (ref 4.0–10.5)
nRBC: 0 % (ref 0.0–0.2)

## 2022-09-28 LAB — VITAMIN B12: Vitamin B-12: 531 pg/mL (ref 180–914)

## 2022-09-28 NOTE — Progress Notes (Signed)
Patient has no concerns today. 

## 2022-09-28 NOTE — Progress Notes (Signed)
[place]  Telephone:(336) [phone] Fax:(336) [phone]  ID: Karlynn Shugars Steury OB: Jun 01, 1956  MR#: [id number]  [id number]  Patient Care Team: Gayland Curry, MD as PCP - General (Family Medicine)  REFERRING PROVIDER: Dr. Astrid Divine  REASON FOR REFERRAL: elevated Hb  HPI: Robin Beck is a 67 y.o. female with past medical history of hyperlipidemia, hypertension and osteopenia was referred to hematology for elevated hemoglobin.  Patient reports feeling well.  Denies any use of smoking.  Denies any liver, lung or kidney problems.  Reports she previously snored but after she had dental procedure the snoring has resolved.  Denies any daytime sleepiness or fatigue.  She drinks 2 glasses of wine on a daily basis. Patient denies fever, chills, nausea, vomiting, shortness of breath, cough, abdominal pain, bleeding, bowel or bladder issues. Energy level is good.  Appetite is good.    Labs reviewed.  CBC from 09/07/2022 showed hemoglobin 16.2, hematocrit 45.8, WBC 5, platelet 236 and MCV 99.  LFTs normal.  From 09/15/2021 hemoglobin was normal at 15.2 MCV of 100.  REVIEW OF SYSTEMS:   ROS  As per HPI. Otherwise, a complete review of systems is negative.  PAST MEDICAL HISTORY: Past Medical History:  Diagnosis Date   Hyperlipidemia    Hypertension    Osteopenia    Stress incontinence    Vitamin D deficiency     PAST SURGICAL HISTORY: Past Surgical History:  Procedure Laterality Date   ABDOMINAL HYSTERECTOMY     COLONOSCOPY WITH PROPOFOL N/A 09/22/2017   Procedure: COLONOSCOPY WITH PROPOFOL;  Surgeon: Toledo, Benay Pike, MD;  Location: [facility] ENDOSCOPY;  Service: Gastroenterology;  Laterality: N/A;    FAMILY HISTORY: Family History  Problem Relation Age of Onset   Hypertension Mother    Heart Problems Father    Melanoma Son    Heart Problems Son    Diabetes Son    Breast cancer Neg Hx     HEALTH MAINTENANCE: Social History   Tobacco Use   Smoking status:  Never   Smokeless tobacco: Never  Vaping Use   Vaping Use: Never used  Substance Use Topics   Alcohol use: Yes    Alcohol/week: 6.0 standard drinks of alcohol    Types: 6 Glasses of wine per week   Drug use: No     Allergies  Allergen Reactions   Pollen Extract Shortness Of Breath   Lisinopril Rash   Sulfa Antibiotics Rash    Current Outpatient Medications  Medication Sig Dispense Refill   Calcium Carbonate-Vitamin D ([employer]S CALCIUM CARBONATE/VIT D PO) Take by mouth.     fexofenadine (ALLEGRA) 180 MG tablet Take 1 tablet (180 mg total) by mouth daily. 30 tablet 1   hydrochlorothiazide (HYDRODIURIL) 25 MG tablet Take 1 tablet by mouth daily.     hydrOXYzine (ATARAX/VISTARIL) 25 MG tablet Take 1 tablet (25 mg total) by mouth every 6 (six) hours. 12 tablet 0   metoprolol succinate (TOPROL-XL) 25 MG 24 hr tablet Take 25 mg by mouth daily.     potassium chloride (KLOR-CON M) 10 MEQ tablet Take by mouth.     pravastatin (PRAVACHOL) 40 MG tablet Take 40 mg by mouth daily.     No current facility-administered medications for this visit.    OBJECTIVE: Vitals:   09/28/22 1053 09/28/22 1055  BP:  124/81  Pulse:  (!) 58  Resp: 18 18  SpO2:  99%     Body mass index is 22.11 kg/m.      General:  Well-developed, well-nourished, no acute distress. Eyes: Pink conjunctiva, anicteric sclera. HEENT: Normocephalic, moist mucous membranes, clear oropharnyx. Lungs: Clear to auscultation bilaterally. Heart: Regular rate and rhythm. No rubs, murmurs, or gallops. Abdomen: Soft, nontender, nondistended. No organomegaly noted, normoactive bowel sounds. Musculoskeletal: No edema, cyanosis, or clubbing. Neuro: Alert, answering all questions appropriately. Cranial nerves grossly intact. Skin: No rashes or petechiae noted. Psych: Normal affect. Lymphatics: No cervical, calvicular, axillary or inguinal LAD.   LAB RESULTS:  No results found for: "NA", "K", "CL", "CO2", "GLUCOSE", "BUN",  "CREATININE", "CALCIUM", "PROT", "ALBUMIN", "AST", "ALT", "ALKPHOS", "BILITOT", "GFRNONAA", "GFRAA"  Lab Results  Component Value Date   WBC 3.7 (L) 09/28/2022   NEUTROABS 1.8 09/28/2022   HGB 15.5 (H) 09/28/2022   HCT 45.2 09/28/2022   MCV 100.4 (H) 09/28/2022   PLT 226 09/28/2022    No results found for: "TIBC", "FERRITIN", "IRONPCTSAT"   STUDIES: MM 3D SCREEN BREAST BILATERAL  Result Date: 09/28/2022 CLINICAL DATA:  Screening. EXAM: DIGITAL SCREENING BILATERAL MAMMOGRAM WITH TOMOSYNTHESIS AND CAD TECHNIQUE: Bilateral screening digital craniocaudal and mediolateral oblique mammograms were obtained. Bilateral screening digital breast tomosynthesis was performed. The images were evaluated with computer-aided detection. COMPARISON:  Previous exam(s). ACR Breast Density Category b: There are scattered areas of fibroglandular density. FINDINGS: There are no findings suspicious for malignancy. IMPRESSION: No mammographic evidence of malignancy. A result letter of this screening mammogram will be mailed directly to the patient. RECOMMENDATION: Screening mammogram in one year. (Code:SM-B-01Y) BI-RADS CATEGORY  1: Negative. Electronically Signed   By: Abelardo Diesel M.D.   On: 09/28/2022 09:40    ASSESSMENT AND PLAN:   Robin Beck is a 67 y.o. female with pmh of hyperlipidemia, hypertension and osteopenia was referred to hematology for elevated hemoglobin.  # Elevated hemoglobin -Of unknown etiology -Labs reviewed.  CBC from 09/07/2022 showed hemoglobin 16.2, hematocrit 45.8, WBC 5, platelet 236 and MCV 99.  LFTs normal.  From 09/15/2021 hemoglobin was normal at 15.2 MCV of 100. -Patient denies any smoking, liver, lung or renal issues.  Medication list was reviewed and no offending agents identified.  Denies snoring or OSA.  -I will obtain labs to rule out any myeloproliferative neoplasm, will also obtain ultrasound of the abdomen to rule out any liver or kidney lesions.  # Macrocytosis without  anemia -Will obtain [id number] and folic acid level.  Ultrasound of the abdomen to rule out any liver pathology. -Patient consumes 2 glasses of wine daily  # Hypertension -On metoprolol and HCTZ  # Hyperlipidemia -On pravastatin  Orders Placed This Encounter  Procedures   US Abdomen Complete   Erythropoietin   CBC with Differential/Platelet   JAK2 V617F rfx CALR/MPL/E12-15   Vitamin B12   Folate   RTC in 3 weeks for MD visit to discuss labs.  Patient expressed understanding and was in agreement with this plan. She also understands that She can call clinic at any time with any questions, concerns, or complaints.   I spent a total of 45 minutes reviewing chart data, face-to-face evaluation with the patient, counseling and coordination of care as detailed above.  Jane Canary, MD   09/28/2022 2:11 PM

## 2022-09-29 LAB — ERYTHROPOIETIN: Erythropoietin: 12 m[IU]/mL (ref 2.6–18.5)

## 2022-09-29 LAB — FOLATE: Folate: 25 ng/mL (ref >5.9)

## 2022-10-01 ENCOUNTER — Ambulatory Visit
Admission: RE | Admit: 2022-10-01 | Discharge: 2022-10-01 | Disposition: A | Discharge status: Home / Self Care | Payer: Medicare PPO | Source: Ambulatory Visit | Attending: Internal Medicine | Admitting: Internal Medicine

## 2022-10-01 DIAGNOSIS — D582 Other hemoglobinopathies: Secondary | ICD-10-CM | POA: Diagnosis present

## 2022-10-06 LAB — JAK2 V617F RFX CALR/MPL/E12-15

## 2022-10-06 LAB — CALR +MPL + E12-E15  (REFLEX)

## 2022-10-07 ENCOUNTER — Other Ambulatory Visit: Payer: Self-pay | Admitting: *Deleted

## 2022-10-07 DIAGNOSIS — N1330 Unspecified hydronephrosis: Secondary | ICD-10-CM

## 2022-10-15 ENCOUNTER — Ambulatory Visit
Admission: RE | Admit: 2022-10-15 | Discharge: 2022-10-15 | Disposition: A | Discharge status: Home / Self Care | Payer: Medicare PPO | Source: Ambulatory Visit | Attending: Internal Medicine | Admitting: Internal Medicine

## 2022-10-15 DIAGNOSIS — N1330 Unspecified hydronephrosis: Secondary | ICD-10-CM | POA: Insufficient documentation

## 2022-10-19 ENCOUNTER — Inpatient Hospital Stay: Payer: Medicare PPO | Attending: Internal Medicine | Admitting: Internal Medicine

## 2022-10-19 ENCOUNTER — Encounter: Payer: Self-pay | Admitting: Internal Medicine

## 2022-10-19 VITALS — BP 121/97 | HR 64 | Temp 97.9°F | Resp 16 | Ht 66.0 in | Wt 135.0 lb

## 2022-10-19 DIAGNOSIS — D7589 Other specified diseases of blood and blood-forming organs: Secondary | ICD-10-CM | POA: Diagnosis not present

## 2022-10-19 DIAGNOSIS — G8929 Other chronic pain: Secondary | ICD-10-CM | POA: Insufficient documentation

## 2022-10-19 DIAGNOSIS — E785 Hyperlipidemia, unspecified: Secondary | ICD-10-CM | POA: Diagnosis not present

## 2022-10-19 DIAGNOSIS — M8580 Other specified disorders of bone density and structure, unspecified site: Secondary | ICD-10-CM | POA: Diagnosis not present

## 2022-10-19 DIAGNOSIS — I10 Essential (primary) hypertension: Secondary | ICD-10-CM | POA: Diagnosis not present

## 2022-10-19 DIAGNOSIS — D582 Other hemoglobinopathies: Secondary | ICD-10-CM | POA: Diagnosis not present

## 2022-10-19 DIAGNOSIS — Z90710 Acquired absence of both cervix and uterus: Secondary | ICD-10-CM | POA: Insufficient documentation

## 2022-10-19 DIAGNOSIS — D751 Secondary polycythemia: Secondary | ICD-10-CM

## 2022-10-19 NOTE — Progress Notes (Signed)
[place]  Telephone:(336) [phone] Fax:(336) [phone]  ID: Robin Beck OB: 1956/01/18  MR#: [id number]  [id number]  Patient Care Team: Gayland Curry, MD as PCP - General (Family Medicine)  REFERRING PROVIDER: Dr. Astrid Divine  REASON FOR REFERRAL: elevated Hb  HPI: Robin Beck is a 67 y.o. female with past medical history of hyperlipidemia, hypertension and osteopenia was referred to hematology for elevated hemoglobin.  Patient reports feeling well.  Denies any use of smoking.  Denies any liver, lung or kidney problems.  Reports she previously snored but after she had dental procedure the snoring has resolved.  Denies any daytime sleepiness or fatigue.  She drinks 2 glasses of wine on a daily basis. Patient denies fever, chills, nausea, vomiting, shortness of breath, cough, abdominal pain, bleeding, bowel or bladder issues. Energy level is good.  Appetite is good.    Labs reviewed.  CBC from 09/07/2022 showed hemoglobin 16.2, hematocrit 45.8, WBC 5, platelet 236 and MCV 99.  LFTs normal.  From 09/15/2021 hemoglobin was normal at 15.2 MCV of 100.  Interval history Patient was seen today accompanied by husband to discuss labs. Denies any new complaints.  Denies any weight loss.  Appetite is good.  She is eating less due to changing her denture.  Has chronic back pain.  Denies any hip pain.  REVIEW OF SYSTEMS:   ROS  As per HPI. Otherwise, a complete review of systems is negative.  PAST MEDICAL HISTORY: Past Medical History:  Diagnosis Date   Hyperlipidemia    Hypertension    Osteopenia    Stress incontinence    Vitamin D deficiency     PAST SURGICAL HISTORY: Past Surgical History:  Procedure Laterality Date   ABDOMINAL HYSTERECTOMY     COLONOSCOPY WITH PROPOFOL N/A 09/22/2017   Procedure: COLONOSCOPY WITH PROPOFOL;  Surgeon: Toledo, Benay Pike, MD;  Location: [facility] ENDOSCOPY;  Service: Gastroenterology;  Laterality: N/A;    FAMILY HISTORY: Family  History  Problem Relation Age of Onset   Hypertension Mother    Heart Problems Father    Melanoma Son    Heart Problems Son    Diabetes Son    Breast cancer Neg Hx     HEALTH MAINTENANCE: Social History   Tobacco Use   Smoking status: Never   Smokeless tobacco: Never  Vaping Use   Vaping Use: Never used  Substance Use Topics   Alcohol use: Yes    Alcohol/week: 6.0 standard drinks of alcohol    Types: 6 Glasses of wine per week   Drug use: No     Allergies  Allergen Reactions   Pollen Extract Shortness Of Breath   Lisinopril Rash   Sulfa Antibiotics Rash    Current Outpatient Medications  Medication Sig Dispense Refill   Calcium Carbonate-Vitamin D (CVS CALCIUM CARBONATE/VIT D PO) Take by mouth.     fexofenadine (ALLEGRA) 180 MG tablet Take 1 tablet (180 mg total) by mouth daily. 30 tablet 1   hydrochlorothiazide (HYDRODIURIL) 25 MG tablet Take 1 tablet by mouth daily.     metoprolol succinate (TOPROL-XL) 25 MG 24 hr tablet Take 25 mg by mouth daily.     pravastatin (PRAVACHOL) 40 MG tablet Take 40 mg by mouth daily.     hydrOXYzine (ATARAX/VISTARIL) 25 MG tablet Take 1 tablet (25 mg total) by mouth every 6 (six) hours. (Patient not taking: Reported on 10/19/2022) 12 tablet 0   potassium chloride (KLOR-CON M) 10 MEQ tablet Take by mouth. (Patient not taking:  Reported on 10/19/2022)     No current facility-administered medications for this visit.    OBJECTIVE: Vitals:   10/19/22 1032  BP: (!) 121/97  Pulse: 64  Resp: 16  Temp: 97.9 F (36.6 C)  SpO2: 100%     Body mass index is 21.79 kg/m.      General: Well-developed, well-nourished, no acute distress. Eyes: Pink conjunctiva, anicteric sclera. HEENT: Normocephalic, moist mucous membranes, clear oropharnyx. Lungs: Clear to auscultation bilaterally. Heart: Regular rate and rhythm. No rubs, murmurs, or gallops. Abdomen: Soft, nontender, nondistended. No organomegaly noted, normoactive bowel  sounds. Musculoskeletal: No edema, cyanosis, or clubbing. Neuro: Alert, answering all questions appropriately. Cranial nerves grossly intact. Skin: No rashes or petechiae noted. Psych: Normal affect. Lymphatics: No cervical, calvicular, axillary or inguinal LAD.   LAB RESULTS:  No results found for: "NA", "K", "CL", "CO2", "GLUCOSE", "BUN", "CREATININE", "CALCIUM", "PROT", "ALBUMIN", "AST", "ALT", "ALKPHOS", "BILITOT", "GFRNONAA", "GFRAA"  Lab Results  Component Value Date   WBC 3.7 (L) 09/28/2022   NEUTROABS 1.8 09/28/2022   HGB 15.5 (H) 09/28/2022   HCT 45.2 09/28/2022   MCV 100.4 (H) 09/28/2022   PLT 226 09/28/2022    No results found for: "TIBC", "FERRITIN", "IRONPCTSAT"   STUDIES: CT Abdomen Pelvis Wo Contrast  Result Date: 10/15/2022 CLINICAL DATA:  Hydronephrosis seen on ultrasound. EXAM: CT ABDOMEN AND PELVIS WITHOUT CONTRAST TECHNIQUE: Multidetector CT imaging of the abdomen and pelvis was performed following the standard protocol without IV contrast. RADIATION DOSE REDUCTION: This exam was performed according to the departmental dose-optimization program which includes automated exposure control, adjustment of the mA and/or kV according to patient size and/or use of iterative reconstruction technique. COMPARISON:  CT abdomen pelvis dated 10/19/2005 and abdominal ultrasound dated 10/01/2022. FINDINGS: Evaluation of this exam is limited in the absence of intravenous contrast. Lower chest: Minimal bibasilar linear scarring. The visualized lung bases are otherwise clear. No intra-abdominal free air or free fluid. Hepatobiliary: The liver is unremarkable. No biliary ductal dilatation. The gallbladder is unremarkable. Pancreas: Unremarkable. No pancreatic ductal dilatation or surrounding inflammatory changes. Spleen: Normal in size without focal abnormality. Adrenals/Urinary Tract: The adrenal glands unremarkable. Multiple small bilateral renal parapelvic cysts noted. The  hydronephrosis or nephrolithiasis. The visualized ureters and urinary bladder appear unremarkable. Stomach/Bowel: There is no bowel obstruction or active inflammation. The appendix is normal. Vascular/Lymphatic: Mild aortoiliac atherosclerotic disease. The IVC is unremarkable. No portal venous gas. There is no adenopathy. Reproductive: Hysterectomy.  No adnexal masses. Other: None Musculoskeletal: Degenerative changes of the spine. Bilateral femoral head avascular necrosis. No cortical collapse. No acute osseous pathology. IMPRESSION: 1. No acute intra-abdominal or pelvic pathology. No hydronephrosis or nephrolithiasis. 2. No bowel obstruction. Normal appendix. 3.  Aortic Atherosclerosis (ICD10-I70.0). Electronically Signed   By: Anner Crete M.D.   On: 10/15/2022 22:20   US Abdomen Complete  Result Date: 10/01/2022 CLINICAL DATA:  Elevated hemoglobin. Evaluate for liver/kidney lesions. Hypercholesterolemia. EXAM: ABDOMEN ULTRASOUND COMPLETE COMPARISON:  None Available. FINDINGS: Gallbladder: Gallbladder has a normal appearance. Gallbladder wall is 1.8 millimeters, within normal limits. No stones or pericholecystic fluid. No sonographic Murphy's sign. Common bile duct: Diameter: 3.3 millimeters. No intrahepatic biliary duct dilatation. Liver: No focal lesion identified. Within normal limits in parenchymal echogenicity. Portal vein is patent on color Doppler imaging with normal direction of blood flow towards the liver. IVC: No abnormality visualized. Pancreas: Visualized portion unremarkable. Spleen: Size and appearance within normal limits. Right Kidney: Length: 10.6 centimeters. Echogenicity within normal limits. No mass or hydronephrosis visualized.  Left Kidney: Length: 10.6 centimeters. Echogenicity is normal. There is mild dilatation of the renal pelvis and calices. No renal mass. Abdominal aorta: No aneurysm visualized. Other findings: None. IMPRESSION: 1. No evidence for acute cholecystitis. 2. Mild  LEFT hydronephrosis. Consider further evaluation CT of the abdomen and pelvis without intravenous contrast to evaluate for possible obstruction. 3. Normal appearance of the RIGHT kidney. Electronically Signed   By: Nolon Nations M.D.   On: 10/01/2022 14:53   MM 3D SCREEN BREAST BILATERAL  Result Date: 09/28/2022 CLINICAL DATA:  Screening. EXAM: DIGITAL SCREENING BILATERAL MAMMOGRAM WITH TOMOSYNTHESIS AND CAD TECHNIQUE: Bilateral screening digital craniocaudal and mediolateral oblique mammograms were obtained. Bilateral screening digital breast tomosynthesis was performed. The images were evaluated with computer-aided detection. COMPARISON:  Previous exam(s). ACR Breast Density Category b: There are scattered areas of fibroglandular density. FINDINGS: There are no findings suspicious for malignancy. IMPRESSION: No mammographic evidence of malignancy. A result letter of this screening mammogram will be mailed directly to the patient. RECOMMENDATION: Screening mammogram in one year. (Code:SM-B-01Y) BI-RADS CATEGORY  1: Negative. Electronically Signed   By: Abelardo Diesel M.D.   On: 09/28/2022 09:40    ASSESSMENT AND PLAN:   Robin Beck is a 67 y.o. female with pmh of hyperlipidemia, hypertension and osteopenia was referred to hematology for elevated hemoglobin.  # Elevated hemoglobin -Likely benign in nature. 09/07/2022 Hb/Htc 16.2/45.8. From 09/15/2021 Hb 15.2/45.2  -EPO level normal.  JAK2 V617F/ exon 21/ CALR/ MPL negative.  Ultrasound abdomen showed no liver or kidney lesions.  There was mild left hydronephrosis.  So we did CT abdomen without contrast and kidneys were normal and there was no concern for any stones.  Patient denies any smoking.  No offending medications identified on the medication list.  Denies any snoring or concern for OSA.  -I discussed with the patient and the husband in detail that her workup has been negative so far.  I do not have concern for myeloproliferative neoplasm.  It is  likely benign in nature.  Repeat CBC showed only mildly elevated hemoglobin at 15.5 with normal hematocrit of 45.  I will recheck her CBC and CMP in 1 year.  If hemoglobin is stable, patient can continue to follow-up with her PCP.  No further hematology workup is indicated  # Macrocytosis without anemia -Patient consumes 2 glasses of wine daily -B12, folate normal.  Ultrasound abdomen showed normal liver.  # Hypertension -On metoprolol and HCTZ  # Hyperlipidemia -On pravastatin  Orders Placed This Encounter  Procedures   CBC with Differential/Platelet   Comprehensive metabolic panel   RTC in 1 year for MD visit, labs  Patient expressed understanding and was in agreement with this plan. She also understands that She can call clinic at any time with any questions, concerns, or complaints.   I spent a total of 30 minutes reviewing chart data, face-to-face evaluation with the patient, counseling and coordination of care as detailed above.  Jane Canary, MD   10/19/2022 11:26 AM

## 2023-03-08 DIAGNOSIS — Z0000 Encounter for general adult medical examination without abnormal findings: Secondary | ICD-10-CM | POA: Diagnosis not present

## 2023-03-08 DIAGNOSIS — I700 Atherosclerosis of aorta: Secondary | ICD-10-CM | POA: Diagnosis not present

## 2023-06-02 ENCOUNTER — Other Ambulatory Visit: Payer: Self-pay | Admitting: Family Medicine

## 2023-06-02 DIAGNOSIS — M8588 Other specified disorders of bone density and structure, other site: Secondary | ICD-10-CM

## 2023-06-18 DIAGNOSIS — H524 Presbyopia: Secondary | ICD-10-CM | POA: Diagnosis not present

## 2023-06-18 DIAGNOSIS — H43813 Vitreous degeneration, bilateral: Secondary | ICD-10-CM | POA: Diagnosis not present

## 2023-06-18 DIAGNOSIS — H25813 Combined forms of age-related cataract, bilateral: Secondary | ICD-10-CM | POA: Diagnosis not present

## 2023-08-18 ENCOUNTER — Encounter: Payer: Self-pay | Admitting: Ophthalmology

## 2023-08-24 NOTE — Discharge Instructions (Signed)

## 2023-08-26 ENCOUNTER — Ambulatory Visit: Payer: Self-pay | Admitting: Anesthesiology

## 2023-08-26 ENCOUNTER — Other Ambulatory Visit: Payer: Self-pay

## 2023-08-26 ENCOUNTER — Encounter: Payer: Self-pay | Admitting: Ophthalmology

## 2023-08-26 ENCOUNTER — Encounter
Admission: RE | Disposition: A | Discharge status: Home / Self Care | Payer: Self-pay | Source: Home / Self Care | Attending: Ophthalmology

## 2023-08-26 ENCOUNTER — Ambulatory Visit
Admission: RE | Admit: 2023-08-26 | Discharge: 2023-08-26 | Disposition: A | Discharge status: Home / Self Care | Payer: Medicare PPO | Attending: Ophthalmology | Admitting: Ophthalmology

## 2023-08-26 DIAGNOSIS — I10 Essential (primary) hypertension: Secondary | ICD-10-CM | POA: Insufficient documentation

## 2023-08-26 DIAGNOSIS — H2512 Age-related nuclear cataract, left eye: Secondary | ICD-10-CM | POA: Diagnosis present

## 2023-08-26 HISTORY — PX: CATARACT EXTRACTION W/PHACO: SHX586

## 2023-08-26 SURGERY — PHACOEMULSIFICATION, CATARACT, WITH IOL INSERTION
Anesthesia: Monitor Anesthesia Care | Laterality: Left

## 2023-08-26 MED ORDER — LACTATED RINGERS IV SOLN: INTRAVENOUS | Status: DC

## 2023-08-26 MED ORDER — BRIMONIDINE TARTRATE-TIMOLOL 0.2-0.5 % OP SOLN
OPHTHALMIC | Status: DC | PRN
  Administered 2023-08-26: 1 [drp] via OPHTHALMIC

## 2023-08-26 MED ORDER — FENTANYL CITRATE (PF) 100 MCG/2ML IJ SOLN
INTRAMUSCULAR | Status: DC | PRN
  Administered 2023-08-26: 50 ug via INTRAVENOUS

## 2023-08-26 MED ORDER — LIDOCAINE HCL (PF) 2 % IJ SOLN
INTRAOCULAR | Status: DC | PRN
  Administered 2023-08-26: 1 mL via INTRAOCULAR

## 2023-08-26 MED ORDER — MIDAZOLAM HCL 2 MG/2ML IJ SOLN
INTRAMUSCULAR | Status: AC
  Filled 2023-08-26: qty 2

## 2023-08-26 MED ORDER — TETRACAINE HCL 0.5 % OP SOLN
1.0000 [drp] | OPHTHALMIC | Status: DC | PRN
  Administered 2023-08-26 (×3): 1 [drp] via OPHTHALMIC

## 2023-08-26 MED ORDER — ARMC OPHTHALMIC DILATING DROPS
1.0000 | OPHTHALMIC | Status: DC | PRN
  Administered 2023-08-26 (×3): 1 via OPHTHALMIC

## 2023-08-26 MED ORDER — SIGHTPATH DOSE#1 BSS IO SOLN
INTRAOCULAR | Status: DC | PRN
  Administered 2023-08-26: 15 mL via INTRAOCULAR

## 2023-08-26 MED ORDER — SIGHTPATH DOSE#1 NA HYALUR & NA CHOND-NA HYALUR IO KIT
PACK | INTRAOCULAR | Status: DC | PRN
  Administered 2023-08-26: 1 via OPHTHALMIC

## 2023-08-26 MED ORDER — FENTANYL CITRATE (PF) 100 MCG/2ML IJ SOLN
INTRAMUSCULAR | Status: AC
  Filled 2023-08-26: qty 2

## 2023-08-26 MED ORDER — MIDAZOLAM HCL 5 MG/5ML IJ SOLN
INTRAMUSCULAR | Status: DC | PRN
  Administered 2023-08-26: 1 mg via INTRAVENOUS

## 2023-08-26 MED ORDER — MOXIFLOXACIN HCL 0.5 % OP SOLN
OPHTHALMIC | Status: DC | PRN
  Administered 2023-08-26: .2 mL via OPHTHALMIC

## 2023-08-26 MED ORDER — ARMC OPHTHALMIC DILATING DROPS
OPHTHALMIC | Status: AC
  Filled 2023-08-26: qty 0.5

## 2023-08-26 MED ORDER — TETRACAINE HCL 0.5 % OP SOLN
OPHTHALMIC | Status: AC
  Filled 2023-08-26: qty 4

## 2023-08-26 MED ORDER — SIGHTPATH DOSE#1 BSS IO SOLN
INTRAOCULAR | Status: DC | PRN
  Administered 2023-08-26: 50 mL via OPHTHALMIC

## 2023-08-26 SURGICAL SUPPLY — 21 items
BNDG EYE OVAL 2 1/8 X 2 5/8 (GAUZE/BANDAGES/DRESSINGS) IMPLANT
CANNULA ANT/CHMB 27G (MISCELLANEOUS) IMPLANT
CANNULA ANT/CHMB 27GA (MISCELLANEOUS)
CATARACT SUITE SIGHTPATH (MISCELLANEOUS) ×1
DISSECTOR HYDRO NUCLEUS 50X22 (MISCELLANEOUS) ×1 IMPLANT
DRSG TEGADERM 2-3/8X2-3/4 SM (GAUZE/BANDAGES/DRESSINGS) ×1 IMPLANT
FEE CATARACT SUITE SIGHTPATH (MISCELLANEOUS) ×1 IMPLANT
GLOVE SURG SYN 7.5 E (GLOVE) ×1
GLOVE SURG SYN 7.5 PF PI (GLOVE) ×1 IMPLANT
GLOVE SURG SYN 8.5 E (GLOVE) ×1
GLOVE SURG SYN 8.5 PF PI (GLOVE) ×1 IMPLANT
LENS ENVISTA 19.5 ×1 IMPLANT
LENS IOL ENVISTA UV+ 19.5 IMPLANT
NDL FILTER BLUNT 18X1 1/2 (NEEDLE) IMPLANT
NEEDLE FILTER BLUNT 18X1 1/2 (NEEDLE)
PACK VIT ANT 23G (MISCELLANEOUS) IMPLANT
RING MALYGIN 7.0 (MISCELLANEOUS) IMPLANT
SUT ETHILON 10-0 CS-B-6CS-B-6 (SUTURE)
SUTURE EHLN 10-0 CS-B-6CS-B-6 (SUTURE) IMPLANT
SYR 3ML LL SCALE MARK (SYRINGE) IMPLANT
SYR 5ML LL (SYRINGE) IMPLANT

## 2023-08-26 NOTE — Anesthesia Postprocedure Evaluation (Signed)
Anesthesia Post Note  Patient: Robin Beck  Procedure(s) Performed: CATARACT EXTRACTION PHACO AND INTRAOCULAR LENS PLACEMENT (IOC) LEFT 5.68, 00:35.0 (Left)  Patient location during evaluation: PACU Anesthesia Type: MAC Level of consciousness: awake and alert Pain management: pain level controlled Vital Signs Assessment: post-procedure vital signs reviewed and stable Respiratory status: spontaneous breathing, nonlabored ventilation, respiratory function stable and patient connected to nasal cannula oxygen Cardiovascular status: stable and blood pressure returned to baseline Postop Assessment: no apparent nausea or vomiting Anesthetic complications: no   No notable events documented.   Last Vitals:  Vitals:   08/26/23 0740 08/26/23 0909  BP: (!) 146/86 (!) 132/92  Pulse: (!) 55   Resp: 10   Temp: (!) 36.4 C 36.8 C  SpO2: 98%     Last Pain:  Vitals:   08/26/23 0909  TempSrc:   PainSc: 0-No pain                 Corinda Gubler

## 2023-08-26 NOTE — H&P (Signed)
[facility]   Primary Care Physician:  Leim Fabry, MD Ophthalmologist: Dr. Deberah Pelton  Pre-Procedure History & Physical: HPI:  Robin Beck is a 68 y.o. female here for cataract surgery.   Past Medical History:  Diagnosis Date   Hyperlipidemia    Hypertension    Osteopenia    Stress incontinence    Vitamin D deficiency     Past Surgical History:  Procedure Laterality Date   ABDOMINAL HYSTERECTOMY     COLONOSCOPY WITH PROPOFOL N/A 09/22/2017   Procedure: COLONOSCOPY WITH PROPOFOL;  Surgeon: Toledo, Boykin Nearing, MD;  Location: [facility] ENDOSCOPY;  Service: Gastroenterology;  Laterality: N/A;    Prior to Admission medications   Medication Sig Start Date End Date Taking? Authorizing Provider  aspirin EC 81 MG tablet Take 81 mg by mouth daily. Swallow whole.   Yes [provider]  Calcium Carbonate-Vitamin D ([employer]S CALCIUM CARBONATE/VIT D PO) Take by mouth.   Yes [provider]  fexofenadine (ALLEGRA) 180 MG tablet Take 1 tablet (180 mg total) by mouth daily. 12/07/20  Yes Becky Augusta, NP  hydrochlorothiazide (HYDRODIURIL) 25 MG tablet Take 1 tablet by mouth daily. 09/22/20  Yes [provider]  metoprolol succinate (TOPROL-XL) 25 MG 24 hr tablet Take 50 mg by mouth daily.   Yes [provider]  Multiple Vitamin (MULTIVITAMIN) tablet Take 1 tablet by mouth daily. [facility]   Yes [provider]  rosuvastatin (CRESTOR) 20 MG tablet Take 20 mg by mouth daily.   Yes [provider]  loratadine (CLARITIN) 10 MG tablet Take 10 mg by mouth daily.  12/07/20  [provider]    Allergies as of 08/12/2023 - Review Complete 10/19/2022  Allergen Reaction Noted   Pollen extract Shortness Of Breath 09/21/2017   Lisinopril Rash 09/21/2017   Sulfa antibiotics Rash 09/21/2017    Family History  Problem Relation Age of Onset   Hypertension Mother    Heart Problems Father    Melanoma Son    Heart Problems Son     Diabetes Son    Breast cancer Neg Hx     Social History   Socioeconomic History   Marital status: Married    Spouse name: Not on file   Number of children: Not on file   Years of education: Not on file   Highest education level: Not on file  Occupational History   Not on file  Tobacco Use   Smoking status: Never   Smokeless tobacco: Never  Vaping Use   Vaping status: Never Used  Substance and Sexual Activity   Alcohol use: Yes    Alcohol/week: 14.0 standard drinks of alcohol    Types: 14 Glasses of wine per week   Drug use: No   Sexual activity: Not on file  Other Topics Concern   Not on file  Social History Narrative   Not on file   Social Drivers of Health   Financial Resource Strain: Low Risk  (08/31/2022)   Received from [facility], [facility]   Overall Financial Resource Strain (CARDIA)    Difficulty of Paying Living Expenses: Not hard at all  Food Insecurity: No Food Insecurity (09/28/2022)   Hunger Vital Sign    Worried About Running Out of Food in the Last Year: Never true    Ran Out of Food in the Last Year: Never true  Transportation Needs: No Transportation Needs (09/28/2022)   PRAPARE - Transportation    Lack of  Transportation (Medical): No    Lack of Transportation (Non-Medical): No  Physical Activity: Sufficiently Active (02/18/2020)   Received from [facility], [facility]   Exercise Vital Sign    Days of Exercise per Week: 5 days    Minutes of Exercise per Session: 40 min  Stress: No Stress Concern Present (02/18/2020)   Received from [facility], [facility]   [employer] of Occupational Health - Occupational Stress Questionnaire    Feeling of Stress : Not at all  Social Connections: Moderately Integrated (02/18/2020)   Received from [facility], [facility]   Social Connection and Isolation Panel  [NHANES]    Frequency of Communication with Friends and Family: More than three times a week    Frequency of Social Gatherings with Friends and Family: Twice a week    Attends Religious Services: More than 4 times per year    Active Member of [employer] or Organizations: No    Attends Banker Meetings: Never    Marital Status: Married  Catering manager Violence: Not At Risk (09/28/2022)   Humiliation, Afraid, Rape, and Kick questionnaire    Fear of Current or Ex-Partner: No    Emotionally Abused: No    Physically Abused: No    Sexually Abused: No    Review of Systems: See HPI, otherwise negative ROS  Physical Exam: Ht 5\' 4"  (1.626 m)   Wt 59.9 kg   BMI 22.66 kg/m  General:   Alert, cooperative in NAD Head:  Normocephalic and atraumatic. Respiratory:  Normal work of breathing. Cardiovascular:  RRR  Impression/Plan: Robin Beck is here for cataract surgery.  Risks, benefits, limitations, and alternatives regarding cataract surgery have been reviewed with the patient.  Questions have been answered.  All parties agreeable.   Estanislado Pandy, MD  08/26/2023, 7:14 AM

## 2023-08-26 NOTE — Anesthesia Preprocedure Evaluation (Signed)
Anesthesia Evaluation  Patient identified by MRN, date of birth, ID band Patient awake    Reviewed: Allergy & Precautions, NPO status , Patient's Chart, lab work & pertinent test results  History of Anesthesia Complications Negative for: history of anesthetic complications  Airway Mallampati: II  TM Distance: >3 FB Neck ROM: Full    Dental  (+) Upper Dentures, Lower Dentures Dentures cemented in:   Pulmonary neg pulmonary ROS, neg sleep apnea, neg COPD, Patient abstained from smoking.Not current smoker   Pulmonary exam normal breath sounds clear to auscultation       Cardiovascular Exercise Tolerance: Good METShypertension, Pt. on medications (-) CAD and (-) Past MI (-) dysrhythmias  Rhythm:Regular Rate:Normal - Systolic murmurs    Neuro/Psych negative neurological ROS  negative psych ROS   GI/Hepatic ,neg GERD  ,,(+)     (-) substance abuse    Endo/Other  neg diabetes    Renal/GU negative Renal ROS     Musculoskeletal   Abdominal   Peds  Hematology   Anesthesia Other Findings Past Medical History: No date: Hyperlipidemia No date: Hypertension No date: Osteopenia No date: Stress incontinence No date: Vitamin D deficiency  Reproductive/Obstetrics                             Anesthesia Physical Anesthesia Plan  ASA: 2  Anesthesia Plan: MAC   Post-op Pain Management:    Induction: Intravenous  PONV Risk Score and Plan: 2 and Midazolam  Airway Management Planned: Nasal Cannula  Additional Equipment:   Intra-op Plan:   Post-operative Plan:   Informed Consent: I have reviewed the patients History and Physical, chart, labs and discussed the procedure including the risks, benefits and alternatives for the proposed anesthesia with the patient or authorized representative who has indicated his/her understanding and acceptance.       Plan Discussed with: CRNA and  Surgeon  Anesthesia Plan Comments: (Explained risks of anesthesia, including PONV, and rare emergencies such as cardiac events, respiratory problems, and allergic reactions, requiring invasive intervention. Discussed the role of CRNA in patient's perioperative care. Patient understands. )       Anesthesia Quick Evaluation

## 2023-08-26 NOTE — Op Note (Signed)
OPERATIVE NOTE  Robin Beck [id number] 08/26/2023   PREOPERATIVE DIAGNOSIS: Nuclear sclerotic cataract left eye. H25.12   POSTOPERATIVE DIAGNOSIS: Nuclear sclerotic cataract left eye. H25.12   PROCEDURE:  Phacoemusification with posterior chamber intraocular lens placement of the left eye  Ultrasound time: Procedure(s): CATARACT EXTRACTION PHACO AND INTRAOCULAR LENS PLACEMENT (IOC) LEFT 5.68, 00:35.0 (Left)  LENS:   Implant Name Type Inv. Item Serial No. Manufacturer Lot No. LRB No. Used Action  LENS ENVISTA 19.5 - [id number]  LENS ENVISTA 19.5 [id number] SIGHT[employer]  Left 1 Implanted      SURGEON:  Julious Payer. Rolley Sims, MD   ANESTHESIA:  Topical with tetracaine drops, augmented with 1% preservative-free intracameral lidocaine.   COMPLICATIONS:  None.   DESCRIPTION OF PROCEDURE:  The patient was identified in the holding room and transported to the operating room and placed in the supine position under the operating microscope.  The left eye was identified as the operative eye, which was prepped and draped in the usual sterile ophthalmic fashion.   A 1 millimeter clear-corneal paracentesis was made inferotemporally. Preservative-free 1% lidocaine mixed with 1:1,000 bisulfite-free aqueous solution of epinephrine was injected into the anterior chamber. The anterior chamber was then filled with Viscoat viscoelastic. A 2.4 millimeter keratome was used to make a clear-corneal incision superotemporally. A curvilinear capsulorrhexis was made with a cystotome and capsulorrhexis forceps. Balanced salt solution was used to hydrodissect and hydrodelineate the nucleus. Phacoemulsification was then used to remove the lens nucleus and epinucleus. The remaining cortex was then removed using the irrigation and aspiration handpiece. Provisc was then placed into the capsular bag to distend it for lens placement. A +19.50 D EE intraocular lens was then injected into the capsular bag. The remaining viscoelastic  was aspirated.   Wounds were hydrated with balanced salt solution.  The anterior chamber was inflated to a physiologic pressure with balanced salt solution.  No wound leaks were noted. Moxifloxacin was injected intracamerally.  Timolol and Brimonidine drops were applied to the eye.  The patient was taken to the recovery room in stable condition without complications of anesthesia or surgery.  [employer] 08/26/2023, 9:06 AM

## 2023-08-26 NOTE — Transfer of Care (Signed)
Immediate Anesthesia Transfer of Care Note  Patient: Robin Beck  Procedure(s) Performed: CATARACT EXTRACTION PHACO AND INTRAOCULAR LENS PLACEMENT (IOC) LEFT 5.68, (Left)  Patient Location: PACU  Anesthesia Type: MAC  Level of Consciousness: awake, alert  and patient cooperative  Airway and Oxygen Therapy: Patient Spontanous Breathing and Patient connected to supplemental oxygen  Post-op Assessment: Post-op Vital signs reviewed, Patient's Cardiovascular Status Stable, Respiratory Function Stable, Patent Airway and No signs of Nausea or vomiting  Post-op Vital Signs: Reviewed and stable  Complications: No notable events documented.

## 2023-08-27 ENCOUNTER — Encounter: Payer: Self-pay | Admitting: Ophthalmology

## 2023-09-08 NOTE — Discharge Instructions (Signed)

## 2023-09-09 ENCOUNTER — Encounter
Admission: RE | Disposition: A | Discharge status: Home / Self Care | Payer: Self-pay | Source: Home / Self Care | Attending: Ophthalmology

## 2023-09-09 ENCOUNTER — Ambulatory Visit: Payer: Medicare PPO | Admitting: Anesthesiology

## 2023-09-09 ENCOUNTER — Other Ambulatory Visit: Payer: Self-pay

## 2023-09-09 ENCOUNTER — Encounter: Payer: Self-pay | Admitting: Ophthalmology

## 2023-09-09 ENCOUNTER — Ambulatory Visit
Admission: RE | Admit: 2023-09-09 | Discharge: 2023-09-09 | Disposition: A | Discharge status: Home / Self Care | Payer: Medicare PPO | Attending: Ophthalmology | Admitting: Ophthalmology

## 2023-09-09 DIAGNOSIS — H2511 Age-related nuclear cataract, right eye: Secondary | ICD-10-CM | POA: Diagnosis present

## 2023-09-09 DIAGNOSIS — I10 Essential (primary) hypertension: Secondary | ICD-10-CM | POA: Diagnosis not present

## 2023-09-09 HISTORY — PX: CATARACT EXTRACTION W/PHACO: SHX586

## 2023-09-09 SURGERY — PHACOEMULSIFICATION, CATARACT, WITH IOL INSERTION
Anesthesia: General | Site: Eye | Laterality: Right

## 2023-09-09 MED ORDER — SODIUM CHLORIDE 0.9% FLUSH: 3.0000 mL | INTRAVENOUS | Status: DC | PRN

## 2023-09-09 MED ORDER — SODIUM CHLORIDE 0.9% FLUSH: 3.0000 mL | Freq: Two times a day (BID) | INTRAVENOUS | Status: DC

## 2023-09-09 MED ORDER — BRIMONIDINE TARTRATE-TIMOLOL 0.2-0.5 % OP SOLN
OPHTHALMIC | Status: DC | PRN
  Administered 2023-09-09: 1 [drp] via OPHTHALMIC

## 2023-09-09 MED ORDER — LIDOCAINE HCL (PF) 2 % IJ SOLN
INTRAOCULAR | Status: DC | PRN
  Administered 2023-09-09: 1 mL via INTRAOCULAR

## 2023-09-09 MED ORDER — MOXIFLOXACIN HCL 0.5 % OP SOLN
OPHTHALMIC | Status: DC | PRN
  Administered 2023-09-09: .2 mL via OPHTHALMIC

## 2023-09-09 MED ORDER — MIDAZOLAM HCL 2 MG/2ML IJ SOLN
INTRAMUSCULAR | Status: AC
  Filled 2023-09-09: qty 2

## 2023-09-09 MED ORDER — SIGHTPATH DOSE#1 BSS IO SOLN
INTRAOCULAR | Status: DC | PRN
  Administered 2023-09-09: 15 mL

## 2023-09-09 MED ORDER — SIGHTPATH DOSE#1 BSS IO SOLN
INTRAOCULAR | Status: DC | PRN
  Administered 2023-09-09: 65 mL via OPHTHALMIC

## 2023-09-09 MED ORDER — TETRACAINE HCL 0.5 % OP SOLN
1.0000 [drp] | OPHTHALMIC | Status: DC | PRN
  Administered 2023-09-09 (×3): 1 [drp] via OPHTHALMIC

## 2023-09-09 MED ORDER — ARMC OPHTHALMIC DILATING DROPS
1.0000 | OPHTHALMIC | Status: DC | PRN
  Administered 2023-09-09 (×3): 1 via OPHTHALMIC

## 2023-09-09 MED ORDER — ARMC OPHTHALMIC DILATING DROPS
OPHTHALMIC | Status: AC
  Filled 2023-09-09: qty 0.5

## 2023-09-09 MED ORDER — MIDAZOLAM HCL 2 MG/2ML IJ SOLN
INTRAMUSCULAR | Status: DC | PRN
  Administered 2023-09-09: 2 mg via INTRAVENOUS

## 2023-09-09 MED ORDER — SIGHTPATH DOSE#1 NA HYALUR & NA CHOND-NA HYALUR IO KIT
PACK | INTRAOCULAR | Status: DC | PRN
  Administered 2023-09-09: 1 via OPHTHALMIC

## 2023-09-09 MED ORDER — TETRACAINE HCL 0.5 % OP SOLN
OPHTHALMIC | Status: AC
  Filled 2023-09-09: qty 4

## 2023-09-09 SURGICAL SUPPLY — 21 items
BNDG EYE OVAL 2 1/8 X 2 5/8 (GAUZE/BANDAGES/DRESSINGS) IMPLANT
CANNULA ANT/CHMB 27G (MISCELLANEOUS) IMPLANT
CANNULA ANT/CHMB 27GA (MISCELLANEOUS)
CATARACT SUITE SIGHTPATH (MISCELLANEOUS) ×1
DISSECTOR HYDRO NUCLEUS 50X22 (MISCELLANEOUS) ×1 IMPLANT
DRSG TEGADERM 2-3/8X2-3/4 SM (GAUZE/BANDAGES/DRESSINGS) ×1 IMPLANT
FEE CATARACT SUITE SIGHTPATH (MISCELLANEOUS) ×1 IMPLANT
GLOVE SURG SYN 7.5 E (GLOVE) ×1
GLOVE SURG SYN 7.5 PF PI (GLOVE) ×1 IMPLANT
GLOVE SURG SYN 8.5 E (GLOVE) ×1
GLOVE SURG SYN 8.5 PF PI (GLOVE) ×1 IMPLANT
LENS CLAREON VIVITY TORIC 19 ×1 IMPLANT
LENS IOL CLRN VT TRC 3 19.0 IMPLANT
NDL FILTER BLUNT 18X1 1/2 (NEEDLE) IMPLANT
NEEDLE FILTER BLUNT 18X1 1/2 (NEEDLE)
PACK VIT ANT 23G (MISCELLANEOUS) IMPLANT
RING MALYGIN 7.0 (MISCELLANEOUS) IMPLANT
SUT ETHILON 10-0 CS-B-6CS-B-6 (SUTURE)
SUTURE EHLN 10-0 CS-B-6CS-B-6 (SUTURE) IMPLANT
SYR 3ML LL SCALE MARK (SYRINGE) IMPLANT
SYR 5ML LL (SYRINGE) IMPLANT

## 2023-09-09 NOTE — Anesthesia Preprocedure Evaluation (Signed)
 Anesthesia Evaluation  Patient identified by MRN, date of birth, ID band Patient awake    Reviewed: Allergy & Precautions, NPO status , Patient's Chart, lab work & pertinent test results  History of Anesthesia Complications Negative for: history of anesthetic complications  Airway Mallampati: II  TM Distance: >3 FB Neck ROM: Full    Dental  (+) Upper Dentures, Lower Dentures Dentures cemented in:   Pulmonary neg pulmonary ROS, neg sleep apnea, neg COPD, Patient abstained from smoking.Not current smoker   Pulmonary exam normal breath sounds clear to auscultation       Cardiovascular Exercise Tolerance: Good METShypertension, Pt. on medications (-) CAD and (-) Past MI (-) dysrhythmias  Rhythm:Regular Rate:Normal - Systolic murmurs    Neuro/Psych negative neurological ROS  negative psych ROS   GI/Hepatic ,neg GERD  ,,(+)     (-) substance abuse    Endo/Other  neg diabetes    Renal/GU negative Renal ROS     Musculoskeletal   Abdominal   Peds  Hematology   Anesthesia Other Findings Past Medical History: No date: Hyperlipidemia No date: Hypertension No date: Osteopenia No date: Stress incontinence No date: Vitamin D deficiency  Reproductive/Obstetrics                              Anesthesia Physical Anesthesia Plan  ASA: 2  Anesthesia Plan: General   Post-op Pain Management:    Induction: Intravenous  PONV Risk Score and Plan: 2 and Midazolam   Airway Management Planned: Natural Airway and Nasal Cannula  Additional Equipment:   Intra-op Plan:   Post-operative Plan:   Informed Consent: I have reviewed the patients History and Physical, chart, labs and discussed the procedure including the risks, benefits and alternatives for the proposed anesthesia with the patient or authorized representative who has indicated his/her understanding and acceptance.     Dental Advisory  Given  Plan Discussed with: Anesthesiologist, CRNA and Surgeon  Anesthesia Plan Comments: (Patient consented for risks of anesthesia including but not limited to:  - adverse reactions to medications - risk of airway placement if required - damage to eyes, teeth, lips or other oral mucosa - nerve damage due to positioning  - sore throat or hoarseness - Damage to heart, brain, nerves, lungs, other parts of body or loss of life  Patient voiced understanding and assent.)        Anesthesia Quick Evaluation

## 2023-09-09 NOTE — H&P (Signed)
 [facility]   Primary Care Physician:  Jyl Railing, MD Ophthalmologist: Dr. Feliciano Ober  Pre-Procedure History & Physical: HPI:  Robin Beck is a 68 y.o. female here for cataract surgery.   Past Medical History:  Diagnosis Date   Hyperlipidemia    Hypertension    Osteopenia    Stress incontinence    Vitamin D deficiency     Past Surgical History:  Procedure Laterality Date   ABDOMINAL HYSTERECTOMY     CATARACT EXTRACTION W/PHACO Left 08/26/2023   Procedure: CATARACT EXTRACTION PHACO AND INTRAOCULAR LENS PLACEMENT (IOC) LEFT 5.68, 00:35.0;  Surgeon: Ober Feliciano Hugger, MD;  Location: [facility];  Service: Ophthalmology;  Laterality: Left;   COLONOSCOPY WITH PROPOFOL  N/A 09/22/2017   Procedure: COLONOSCOPY WITH PROPOFOL ;  Surgeon: Toledo, Ladell POUR, MD;  Location: [facility];  Service: Gastroenterology;  Laterality: N/A;    Prior to Admission medications   Medication Sig Start Date End Date Taking? Authorizing Provider  aspirin EC 81 MG tablet Take 81 mg by mouth daily. Swallow whole.    [provider]  Calcium Carbonate-Vitamin D ([facility] CALCIUM CARBONATE/VIT D PO) Take by mouth.    [provider]  fexofenadine  (ALLEGRA ) 180 MG tablet Take 1 tablet (180 mg total) by mouth daily. 12/07/20   Bernardino Ditch, NP  hydrochlorothiazide (HYDRODIURIL) 25 MG tablet Take 1 tablet by mouth daily. 09/22/20   [provider]  metoprolol succinate (TOPROL-XL) 25 MG 24 hr tablet Take 50 mg by mouth daily.    [provider]  Multiple Vitamin (MULTIVITAMIN) tablet Take 1 tablet by mouth daily. [employer]    [provider]  rosuvastatin (CRESTOR) 20 MG tablet Take 20 mg by mouth daily.    [provider]  loratadine (CLARITIN) 10 MG tablet Take 10 mg by mouth daily.  12/07/20  [provider]    Allergies as of 08/12/2023 - Review Complete 10/19/2022  Allergen Reaction Noted   Pollen extract Shortness Of  Breath 09/21/2017   Lisinopril Rash 09/21/2017   Sulfa antibiotics Rash 09/21/2017    Family History  Problem Relation Age of Onset   Hypertension Mother    Heart Problems Father    Melanoma Son    Heart Problems Son    Diabetes Son    Breast cancer Neg Hx     Social History   Socioeconomic History   Marital status: Married    Spouse name: Not on file   Number of children: Not on file   Years of education: Not on file   Highest education level: Not on file  Occupational History   Not on file  Tobacco Use   Smoking status: Never   Smokeless tobacco: Never  Vaping Use   Vaping status: Never Used  Substance and Sexual Activity   Alcohol use: Yes    Alcohol/week: 14.0 standard drinks of alcohol    Types: 14 Glasses of wine per week   Drug use: No   Sexual activity: Not on file  Other Topics Concern   Not on file  Social History Narrative   Not on file   Social Drivers of Health   Financial Resource Strain: Low Risk  (08/31/2022)   Received from [facility], [facility]   Overall Financial Resource Strain (CARDIA)    Difficulty of Paying Living Expenses: Not hard at all  Food Insecurity: No Food Insecurity (09/28/2022)   Hunger Vital Sign    Worried About Running Out  of Food in the Last Year: Never true    Ran Out of Food in the Last Year: Never true  Transportation Needs: No Transportation Needs (09/28/2022)   PRAPARE - Administrator, Civil Service (Medical): No    Lack of Transportation (Non-Medical): No  Physical Activity: Sufficiently Active (02/18/2020)   Received from [facility], [facility]   Exercise Vital Sign    Days of Exercise per Week: 5 days    Minutes of Exercise per Session: 40 min  Stress: No Stress Concern Present (02/18/2020)   Received from [facility], [facility]   [employer] of Occupational Health - Occupational  Stress Questionnaire    Feeling of Stress : Not at all  Social Connections: Moderately Integrated (02/18/2020)   Received from [facility], [facility]   Social Connection and Isolation Panel [NHANES]    Frequency of Communication with Friends and Family: More than three times a week    Frequency of Social Gatherings with Friends and Family: Twice a week    Attends Religious Services: More than 4 times per year    Active Member of [employer] or Organizations: No    Attends Banker Meetings: Never    Marital Status: Married  Catering Manager Violence: Not At Risk (09/28/2022)   Humiliation, Afraid, Rape, and Kick questionnaire    Fear of Current or Ex-Partner: No    Emotionally Abused: No    Physically Abused: No    Sexually Abused: No    Review of Systems: See HPI, otherwise negative ROS  Physical Exam: There were no vitals taken for this visit. General:   Alert, cooperative in NAD Head:  Normocephalic and atraumatic. Respiratory:  Normal work of breathing. Cardiovascular:  RRR  Impression/Plan: Robin Beck is here for cataract surgery.  Risks, benefits, limitations, and alternatives regarding cataract surgery have been reviewed with the patient.  Questions have been answered.  All parties agreeable.   Feliciano Bryan Ober, MD  09/09/2023, 7:07 AM

## 2023-09-09 NOTE — Transfer of Care (Signed)
 Immediate Anesthesia Transfer of Care Note  Patient: Robin Beck  Procedure(s) Performed: CATARACT EXTRACTION PHACO AND INTRAOCULAR LENS PLACEMENT (IOC) RIGHT (Right: Eye)  Patient Location: PACU  Anesthesia Type: General  Level of Consciousness: awake, alert  and patient cooperative  Airway and Oxygen Therapy: Patient Spontanous Breathing and Patient connected to supplemental oxygen  Post-op Assessment: Post-op Vital signs reviewed, Patient's Cardiovascular Status Stable, Respiratory Function Stable, Patent Airway and No signs of Nausea or vomiting  Post-op Vital Signs: Reviewed and stable  Complications: No notable events documented.

## 2023-09-09 NOTE — Anesthesia Postprocedure Evaluation (Signed)
 Anesthesia Post Note  Patient: Robin Beck  Procedure(s) Performed: CATARACT EXTRACTION PHACO AND INTRAOCULAR LENS PLACEMENT (IOC) RIGHT (Right: Eye)  Patient location during evaluation: PACU Anesthesia Type: General Level of consciousness: awake and alert Pain management: pain level controlled Vital Signs Assessment: post-procedure vital signs reviewed and stable Respiratory status: spontaneous breathing, nonlabored ventilation, respiratory function stable and patient connected to nasal cannula oxygen Cardiovascular status: blood pressure returned to baseline and stable Postop Assessment: no apparent nausea or vomiting Anesthetic complications: no  No notable events documented.   Last Vitals:  Vitals:   09/09/23 0918 09/09/23 0922  BP: 127/78 123/84  Pulse: (!) 52 (!) 52  Resp: 16 14  Temp: (!) 36.1 C (!) 36.1 C  SpO2: 100% 98%    Last Pain:  Vitals:   09/09/23 0922  TempSrc:   PainSc: 0-No pain                 Robin Beck

## 2023-09-09 NOTE — Op Note (Signed)
 OPERATIVE NOTE  Robin Beck [id number] 09/09/2023   PREOPERATIVE DIAGNOSIS: Nuclear sclerotic cataract right eye. H25.11   POSTOPERATIVE DIAGNOSIS: Nuclear sclerotic cataract right eye. H25.11   PROCEDURE:  Phacoemusification with posterior chamber intraocular lens placement of the right eye  Ultrasound time: Procedure(s) with comments: CATARACT EXTRACTION PHACO AND INTRAOCULAR LENS PLACEMENT (IOC) RIGHT (Right) - 7.01 0:38.8  LENS:   Implant Name Type Inv. Item Serial No. Manufacturer Lot No. LRB No. Used Action  ENVISTA ASPIRE 19.00 IOL Intraocular Lens  [id number] [employer]  Right 1 Implanted      SURGEON:  Feliciano HERO. Enola, MD   ANESTHESIA:  Topical with tetracaine  drops, augmented with 1% preservative-free intracameral lidocaine .   COMPLICATIONS:  None.   DESCRIPTION OF PROCEDURE:  The patient was identified in the holding room and transported to the operating room and placed in the supine position under the operating microscope.  The right eye was identified as the operative eye, which was prepped and draped in the usual sterile ophthalmic fashion.   A 1 millimeter clear-corneal paracentesis was made superotemporally. Preservative-free 1% lidocaine  mixed with 1:1,000 bisulfite-free aqueous solution of epinephrine  was injected into the anterior chamber. The anterior chamber was then filled with Viscoat viscoelastic. A 2.4 millimeter keratome was used to make a clear-corneal incision inferotemporally. A curvilinear capsulorrhexis was made with a cystotome and capsulorrhexis forceps. Balanced salt  solution was used to hydrodissect and hydrodelineate the nucleus. Phacoemulsification was then used to remove the lens nucleus and epinucleus. The remaining cortex was then removed using the irrigation and aspiration handpiece. Provisc was then placed into the capsular bag to distend it for lens placement. A +19.00 D EA intraocular lens was then injected into the capsular bag.  The remaining viscoelastic was aspirated.   Wounds were hydrated with balanced salt  solution.  The anterior chamber was inflated to a physiologic pressure with balanced salt  solution.  No wound leaks were noted. Moxifloxacin  was injected intracamerally.  Timolol  and Brimonidine  drops were applied to the eye.  The patient was taken to the recovery room in stable condition without complications of anesthesia or surgery.  [employer] 09/09/2023, 9:16 AM

## 2023-09-10 ENCOUNTER — Encounter: Payer: Self-pay | Admitting: Ophthalmology

## 2023-10-01 ENCOUNTER — Telehealth: Payer: Self-pay | Admitting: Internal Medicine

## 2023-10-01 NOTE — Telephone Encounter (Signed)
 Patient left voicemail to cancel appointments-appointments canceled as requested   Called patient to see if she wanted to reschedule- no answer- left voicemail

## 2023-10-19 ENCOUNTER — Other Ambulatory Visit: Payer: Medicare PPO

## 2023-10-19 ENCOUNTER — Ambulatory Visit: Payer: Medicare PPO | Admitting: Internal Medicine

## 2023-11-02 ENCOUNTER — Other Ambulatory Visit: Payer: Self-pay | Admitting: Family Medicine

## 2023-11-02 DIAGNOSIS — Z1231 Encounter for screening mammogram for malignant neoplasm of breast: Secondary | ICD-10-CM

## 2023-11-25 ENCOUNTER — Ambulatory Visit
Admission: RE | Admit: 2023-11-25 | Discharge: 2023-11-25 | Disposition: A | Discharge status: Home / Self Care | Source: Ambulatory Visit | Attending: Family Medicine | Admitting: Family Medicine

## 2023-11-25 DIAGNOSIS — Z1231 Encounter for screening mammogram for malignant neoplasm of breast: Secondary | ICD-10-CM | POA: Diagnosis present

## 2023-11-25 DIAGNOSIS — M8588 Other specified disorders of bone density and structure, other site: Secondary | ICD-10-CM | POA: Diagnosis present
